# Patient Record
Sex: Male | Born: 1970 | Race: Black or African American | Hispanic: No | Marital: Married | Smoking: Former smoker
Health system: Southern US, Community
[De-identification: ages and names within clinical notes are randomized; demographics above are authoritative.]

## PROBLEM LIST (undated history)

## (undated) DIAGNOSIS — I10 Essential (primary) hypertension: Secondary | ICD-10-CM

## (undated) DIAGNOSIS — F419 Anxiety disorder, unspecified: Secondary | ICD-10-CM

## (undated) DIAGNOSIS — F1910 Other psychoactive substance abuse, uncomplicated: Secondary | ICD-10-CM

## (undated) DIAGNOSIS — K219 Gastro-esophageal reflux disease without esophagitis: Secondary | ICD-10-CM

## (undated) DIAGNOSIS — F32A Depression, unspecified: Secondary | ICD-10-CM

## (undated) HISTORY — PX: NO PAST SURGERIES: SHX2092

---

## 2007-10-30 ENCOUNTER — Ambulatory Visit: Payer: Self-pay | Admitting: Family Medicine

## 2007-10-30 ENCOUNTER — Ambulatory Visit: Payer: Self-pay | Admitting: *Deleted

## 2007-10-30 LAB — CONVERTED CEMR LAB
BUN: 13 mg/dL (ref 6–23)
Basophils Relative: 1 % (ref 0–1)
Eosinophils Absolute: 0.2 10*3/uL (ref 0.0–0.7)
Glucose, Bld: 73 mg/dL (ref 70–99)
HCT: 45.2 % (ref 39.0–52.0)
HDL: 25 mg/dL — ABNORMAL LOW (ref >39)
LDL Cholesterol: 77 mg/dL (ref 0–99)
Lymphocytes Relative: 41 % (ref 12–46)
Lymphs Abs: 2.9 10*3/uL (ref 0.7–4.0)
MCHC: 36.1 g/dL — ABNORMAL HIGH (ref 30.0–36.0)
MCV: 91.7 fL (ref 78.0–100.0)
Monocytes Absolute: 0.7 10*3/uL (ref 0.1–1.0)
Neutrophils Relative %: 46 % (ref 43–77)
Potassium: 4.1 meq/L (ref 3.5–5.3)
RBC: 4.93 M/uL (ref 4.22–5.81)
Sodium: 139 meq/L (ref 135–145)
Total Bilirubin: 0.4 mg/dL (ref 0.3–1.2)
Total CHOL/HDL Ratio: 6.5

## 2008-05-06 ENCOUNTER — Ambulatory Visit: Payer: Self-pay | Admitting: Internal Medicine

## 2008-05-15 ENCOUNTER — Ambulatory Visit: Payer: Self-pay | Admitting: Internal Medicine

## 2008-11-05 ENCOUNTER — Ambulatory Visit: Payer: Self-pay | Admitting: Family Medicine

## 2008-11-05 LAB — CONVERTED CEMR LAB
BUN: 12 mg/dL (ref 6–23)
CO2: 24 meq/L (ref 19–32)
Chloride: 104 meq/L (ref 96–112)
Cholesterol: 160 mg/dL (ref 0–200)
Glucose, Bld: 80 mg/dL (ref 70–99)
HDL: 27 mg/dL — ABNORMAL LOW (ref >39)
Sodium: 138 meq/L (ref 135–145)
Triglycerides: 253 mg/dL — ABNORMAL HIGH (ref <150)
VLDL: 51 mg/dL — ABNORMAL HIGH (ref 0–40)

## 2009-03-26 ENCOUNTER — Ambulatory Visit: Payer: Self-pay | Admitting: Family Medicine

## 2009-06-27 ENCOUNTER — Emergency Department (HOSPITAL_COMMUNITY): Admission: EM | Admit: 2009-06-27 | Discharge: 2009-06-27 | Payer: Self-pay | Admitting: Emergency Medicine

## 2009-09-08 ENCOUNTER — Ambulatory Visit: Payer: Self-pay | Admitting: Internal Medicine

## 2009-10-11 ENCOUNTER — Emergency Department (HOSPITAL_COMMUNITY): Admission: EM | Admit: 2009-10-11 | Discharge: 2009-10-11 | Payer: Self-pay | Admitting: Emergency Medicine

## 2010-06-16 LAB — URINALYSIS, ROUTINE W REFLEX MICROSCOPIC
Bilirubin Urine: NEGATIVE
Glucose, UA: NEGATIVE mg/dL
Hgb urine dipstick: NEGATIVE
Protein, ur: NEGATIVE mg/dL
Specific Gravity, Urine: 1.023 (ref 1.005–1.030)
pH: 7.5 (ref 5.0–8.0)

## 2010-06-16 LAB — POCT CARDIAC MARKERS
CKMB, poc: <1 ng/mL — ABNORMAL LOW (ref 1.0–8.0)
Myoglobin, poc: 49.1 ng/mL (ref 12–200)
Troponin i, poc: <0.05 ng/mL (ref 0.00–0.09)

## 2010-06-16 LAB — BASIC METABOLIC PANEL
BUN: 15 mg/dL (ref 6–23)
CO2: 19 mEq/L (ref 19–32)
Chloride: 106 mEq/L (ref 96–112)
GFR calc Af Amer: >60 mL/min (ref >60)
GFR calc non Af Amer: >60 mL/min (ref >60)
Glucose, Bld: 95 mg/dL (ref 70–99)

## 2010-06-16 LAB — DIFFERENTIAL
Basophils Absolute: 0.1 10*3/uL (ref 0.0–0.1)
Eosinophils Absolute: 0.2 10*3/uL (ref 0.0–0.7)
Eosinophils Relative: 2 % (ref 0–5)
Lymphs Abs: 2.9 10*3/uL (ref 0.7–4.0)
Monocytes Absolute: 0.6 10*3/uL (ref 0.1–1.0)
Monocytes Relative: 7 % (ref 3–12)
Neutrophils Relative %: 53 % (ref 43–77)

## 2010-06-16 LAB — CBC
MCV: 97.8 fL (ref 78.0–100.0)
Platelets: 231 10*3/uL (ref 150–400)
RDW: 12.8 % (ref 11.5–15.5)
WBC: 7.9 10*3/uL (ref 4.0–10.5)

## 2011-05-05 ENCOUNTER — Emergency Department (HOSPITAL_COMMUNITY)
Admission: EM | Admit: 2011-05-05 | Discharge: 2011-05-05 | Disposition: A | Discharge status: Home / Self Care | Payer: Self-pay | Attending: Emergency Medicine | Admitting: Emergency Medicine

## 2011-05-05 ENCOUNTER — Encounter (HOSPITAL_COMMUNITY): Payer: Self-pay | Admitting: *Deleted

## 2011-05-05 DIAGNOSIS — M25569 Pain in unspecified knee: Secondary | ICD-10-CM | POA: Insufficient documentation

## 2011-05-05 DIAGNOSIS — M25562 Pain in left knee: Secondary | ICD-10-CM

## 2011-05-05 MED ORDER — IBUPROFEN 800 MG PO TABS: 800.0000 mg | ORAL_TABLET | Freq: Three times a day (TID) | ORAL | Status: AC

## 2011-05-05 MED ORDER — HYDROCODONE-ACETAMINOPHEN 5-325 MG PO TABS: 2.0000 | ORAL_TABLET | ORAL | Status: AC | PRN

## 2011-05-05 NOTE — ED Provider Notes (Signed)
Medical screening examination/treatment/procedure(s) were performed by non-physician practitioner and as supervising physician I was immediately available for consultation/collaboration.  Linwood Gullikson, MD 05/05/11 0854 

## 2011-05-05 NOTE — ED Notes (Signed)
Pt reports (L) knee pain for years.  States that he 'popped' it 3 days ago and has had increased pain since that time.  No deformity, swelling, bruising noted.  Pt ambulatory.

## 2011-05-05 NOTE — ED Notes (Signed)
EDPA into room 

## 2011-05-05 NOTE — ED Notes (Signed)
Pt reports knee buckles when he walks. Reports pain 6/10, worse when walking. Sore above knee and medially. No radiation up or down leg, (denies: fever). No swelling noted, CMS intact. L knee mildly warmer than R knee.

## 2011-05-05 NOTE — ED Provider Notes (Signed)
History     CSN: [id number]  Arrival date & time 05/05/11  0611   First MD Initiated Contact with Patient 05/05/11 0636      Chief Complaint  Patient presents with  . Knee Pain    (Consider location/radiation/quality/duration/timing/severity/associated sxs/prior treatment) Patient is a 41 y.o. male presenting with knee pain. The history is provided by the patient.  Knee Pain This is a new problem. The current episode started in the past 7 days. The problem occurs constantly. The problem has been unchanged. Associated symptoms include arthralgias. Pertinent negatives include no chills or fever. Associated symptoms comments: Three days of sharp catching pain in left knee without known injury. No swelling. He complains of persistent pain anteriorly. Marland Kitchen    History reviewed. No pertinent past medical history.  History reviewed. No pertinent past surgical history.  History reviewed. No pertinent family history.  History  Substance Use Topics  . Smoking status: Current Everyday Smoker -- 0.5 packs/day for 20 years    Types: Cigarettes  . Smokeless tobacco: Not on file  . Alcohol Use: No      Review of Systems  Constitutional: Negative for fever and chills.  HENT: Negative.   Respiratory: Negative.   Cardiovascular: Negative.   Gastrointestinal: Negative.   Musculoskeletal: Positive for arthralgias.  Skin: Negative.   Neurological: Negative.     Allergies  Bee  Home Medications   Current Outpatient Rx  Name Route Sig Dispense Refill  . PANTOPRAZOLE SODIUM 40 MG PO TBEC Oral Take 40 mg by mouth daily.    Marland Kitchen PAROXETINE HCL 30 MG PO TABS Oral Take 30 mg by mouth every morning.      BP 141/84  Pulse 66  Temp(Src) 97.6 F (36.4 C) (Oral)  Resp 16  SpO2 98%  Physical Exam  Constitutional: He is oriented to person, place, and time. He appears well-developed and well-nourished.  Neck: Normal range of motion.  Pulmonary/Chest: Effort normal.  Musculoskeletal: Normal  range of motion.       Left knee unremarkable in appearance. No swelling, discoloration or warmth to touch. Joint stable. Tenderness to palpation at quadriceps insertion bilaterally. Calf and thigh are nontender. Distal pulses present.  Neurological: He is alert and oriented to person, place, and time.  Skin: Skin is warm and dry.  Psychiatric: He has a normal mood and affect.    ED Course  Procedures (including critical care time)  Labs Reviewed - No data to display No results found.   No diagnosis found.    MDM          Rodena Medin, PA-C 05/05/11 [phone]

## 2011-05-12 IMAGING — CR DG CHEST 2V
2 series · 2 of 2 positions shown · non-contrast
Comparison: None.

CLINICAL DATA: Left-sided chest pain.

CHEST - 2 VIEW

[w chest pa]
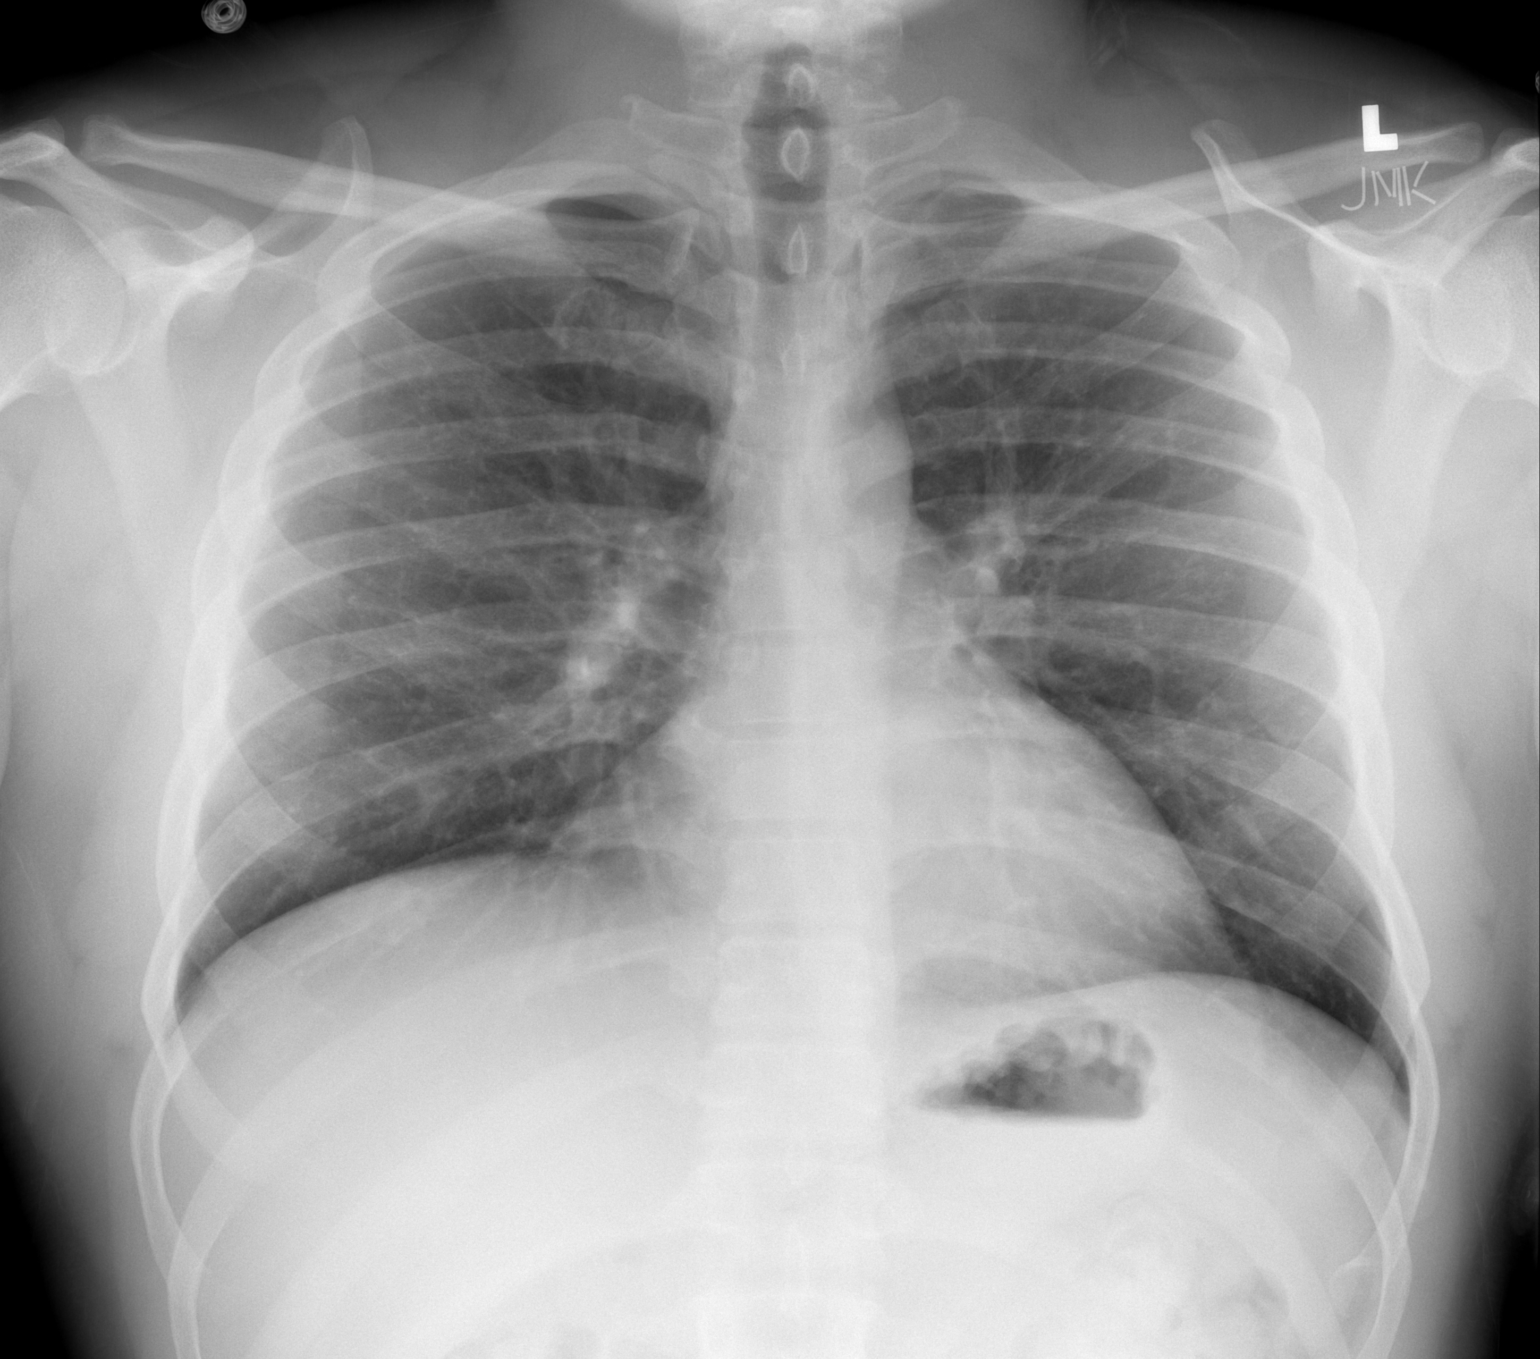

[w chest lat]
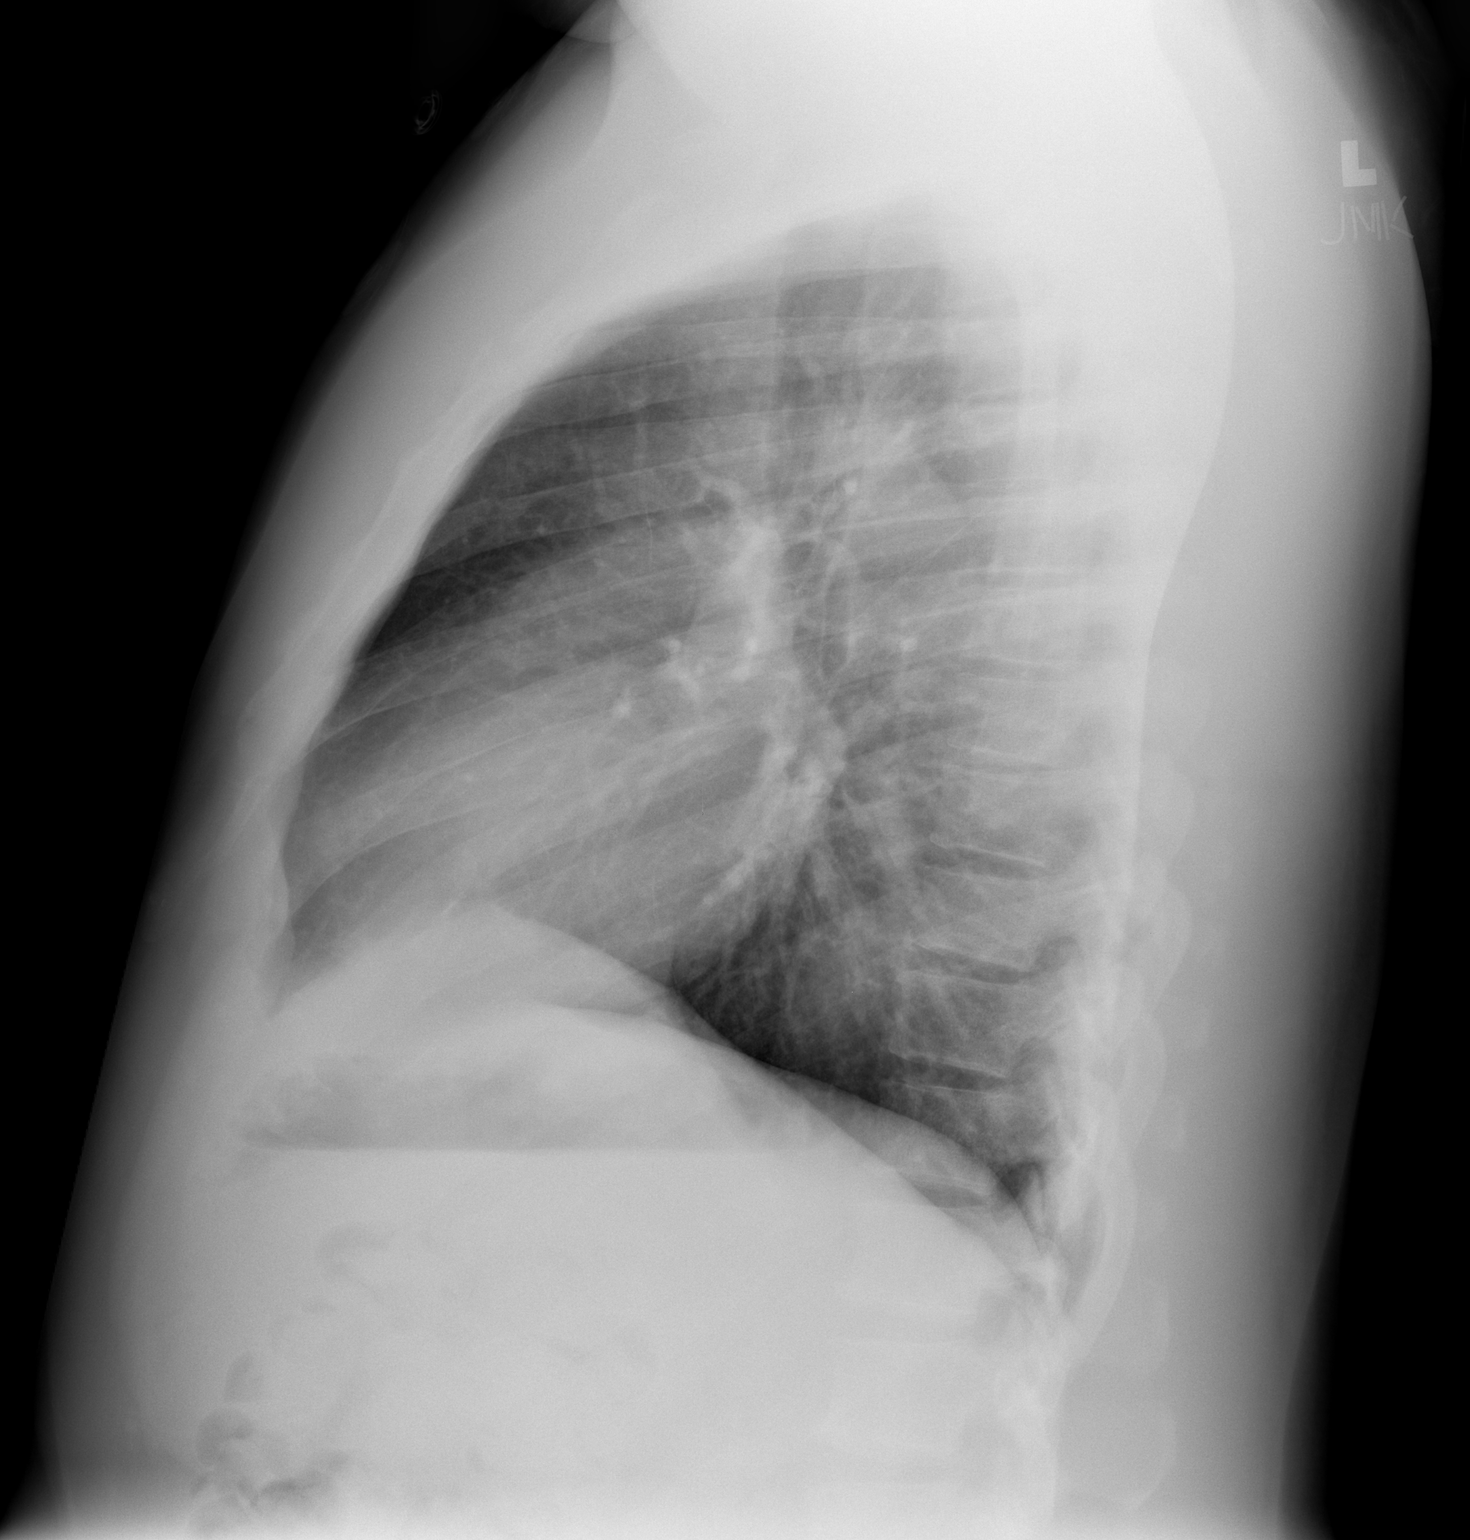

[2 of 2 positions shown; findings below may reference images not displayed]

FINDINGS: No infiltrate, congestive heart failure or pneumothorax.
Heart size top normal with minimally globular configuration.  By
plain film exam aorta appears unremarkable.
IMPRESSION: No infiltrate, congestive heart failure or pneumothorax.

Heart size top normal with minimally globular configuration.

## 2015-11-13 ENCOUNTER — Encounter (HOSPITAL_COMMUNITY): Payer: Self-pay

## 2015-11-13 ENCOUNTER — Inpatient Hospital Stay (HOSPITAL_COMMUNITY)
Admission: AD | Admit: 2015-11-13 | Discharge: 2015-11-17 | DRG: 897 | Disposition: A | Discharge status: Home / Self Care | Payer: Federal, State, Local not specified - Other | Source: Intra-hospital | Attending: Psychiatry | Admitting: Psychiatry

## 2015-11-13 ENCOUNTER — Emergency Department (HOSPITAL_COMMUNITY)
Admission: EM | Admit: 2015-11-13 | Discharge: 2015-11-13 | Payer: Self-pay | Attending: Emergency Medicine | Admitting: Emergency Medicine

## 2015-11-13 DIAGNOSIS — F17210 Nicotine dependence, cigarettes, uncomplicated: Secondary | ICD-10-CM | POA: Insufficient documentation

## 2015-11-13 DIAGNOSIS — I10 Essential (primary) hypertension: Secondary | ICD-10-CM | POA: Diagnosis present

## 2015-11-13 DIAGNOSIS — F411 Generalized anxiety disorder: Secondary | ICD-10-CM | POA: Diagnosis present

## 2015-11-13 DIAGNOSIS — F32A Depression, unspecified: Secondary | ICD-10-CM

## 2015-11-13 DIAGNOSIS — F1910 Other psychoactive substance abuse, uncomplicated: Secondary | ICD-10-CM | POA: Insufficient documentation

## 2015-11-13 DIAGNOSIS — F1190 Opioid use, unspecified, uncomplicated: Secondary | ICD-10-CM | POA: Insufficient documentation

## 2015-11-13 DIAGNOSIS — M549 Dorsalgia, unspecified: Secondary | ICD-10-CM | POA: Insufficient documentation

## 2015-11-13 DIAGNOSIS — Z791 Long term (current) use of non-steroidal anti-inflammatories (NSAID): Secondary | ICD-10-CM | POA: Diagnosis not present

## 2015-11-13 DIAGNOSIS — R45851 Suicidal ideations: Secondary | ICD-10-CM | POA: Diagnosis present

## 2015-11-13 DIAGNOSIS — Z5181 Encounter for therapeutic drug level monitoring: Secondary | ICD-10-CM | POA: Insufficient documentation

## 2015-11-13 DIAGNOSIS — G4700 Insomnia, unspecified: Secondary | ICD-10-CM | POA: Diagnosis present

## 2015-11-13 DIAGNOSIS — F329 Major depressive disorder, single episode, unspecified: Secondary | ICD-10-CM | POA: Insufficient documentation

## 2015-11-13 DIAGNOSIS — F1123 Opioid dependence with withdrawal: Secondary | ICD-10-CM | POA: Diagnosis present

## 2015-11-13 DIAGNOSIS — F1120 Opioid dependence, uncomplicated: Secondary | ICD-10-CM

## 2015-11-13 DIAGNOSIS — Z79899 Other long term (current) drug therapy: Secondary | ICD-10-CM | POA: Diagnosis not present

## 2015-11-13 DIAGNOSIS — F17290 Nicotine dependence, other tobacco product, uncomplicated: Secondary | ICD-10-CM | POA: Insufficient documentation

## 2015-11-13 LAB — COMPREHENSIVE METABOLIC PANEL
ALT: 65 U/L — ABNORMAL HIGH (ref 17–63)
AST: 42 U/L — ABNORMAL HIGH (ref 15–41)
Albumin: 4.3 g/dL (ref 3.5–5.0)
Alkaline Phosphatase: 126 U/L (ref 38–126)
Anion gap: 8 (ref 5–15)
BUN: 8 mg/dL (ref 6–20)
CO2: 24 mmol/L (ref 22–32)
Calcium: 9.8 mg/dL (ref 8.9–10.3)
Chloride: 103 mmol/L (ref 101–111)
Creatinine, Ser: 0.82 mg/dL (ref 0.61–1.24)
GFR calc Af Amer: >60 mL/min (ref >60)
GFR calc non Af Amer: >60 mL/min (ref >60)
Glucose, Bld: 96 mg/dL (ref 65–99)
Potassium: 3.9 mmol/L (ref 3.5–5.1)
Sodium: 135 mmol/L (ref 135–145)
Total Bilirubin: 0.8 mg/dL (ref 0.3–1.2)
Total Protein: 9.4 g/dL — ABNORMAL HIGH (ref 6.5–8.1)

## 2015-11-13 LAB — CBC
HCT: 43.3 % (ref 39.0–52.0)
Hemoglobin: 15.7 g/dL (ref 13.0–17.0)
MCH: 31.9 pg (ref 26.0–34.0)
MCHC: 36.3 g/dL — ABNORMAL HIGH (ref 30.0–36.0)
MCV: 88 fL (ref 78.0–100.0)
Platelets: 349 10*3/uL (ref 150–400)
RBC: 4.92 MIL/uL (ref 4.22–5.81)
RDW: 12.5 % (ref 11.5–15.5)
WBC: 7.8 10*3/uL (ref 4.0–10.5)

## 2015-11-13 LAB — SALICYLATE LEVEL: Salicylate Lvl: <4 mg/dL (ref 2.8–30.0)

## 2015-11-13 LAB — RAPID URINE DRUG SCREEN, HOSP PERFORMED
Amphetamines: NOT DETECTED
Barbiturates: NOT DETECTED
Benzodiazepines: NOT DETECTED
Cocaine: NOT DETECTED
Opiates: POSITIVE — AB
Tetrahydrocannabinol: NOT DETECTED

## 2015-11-13 LAB — ETHANOL: Alcohol, Ethyl (B): <5 mg/dL (ref <5)

## 2015-11-13 LAB — ACETAMINOPHEN LEVEL: Acetaminophen (Tylenol), Serum: <10 ug/mL — ABNORMAL LOW (ref 10–30)

## 2015-11-13 MED ORDER — CLONIDINE HCL 0.1 MG PO TABS
0.2000 mg | ORAL_TABLET | Freq: Once | ORAL | Status: AC
  Administered 2015-11-13: 0.2 mg via ORAL
  Filled 2015-11-13: qty 2

## 2015-11-13 MED ORDER — LOPERAMIDE HCL 2 MG PO CAPS
2.0000 mg | ORAL_CAPSULE | ORAL | Status: DC | PRN
  Administered 2015-11-13 – 2015-11-16 (×2): 4 mg via ORAL
  Filled 2015-11-13 (×2): qty 2

## 2015-11-13 MED ORDER — LOPERAMIDE HCL 2 MG PO CAPS: 2.0000 mg | ORAL_CAPSULE | ORAL | Status: DC | PRN

## 2015-11-13 MED ORDER — ONDANSETRON 4 MG PO TBDP: 4.0000 mg | ORAL_TABLET | Freq: Four times a day (QID) | ORAL | Status: DC | PRN

## 2015-11-13 MED ORDER — HYDROXYZINE HCL 25 MG PO TABS
25.0000 mg | ORAL_TABLET | Freq: Four times a day (QID) | ORAL | Status: DC | PRN
  Administered 2015-11-13 – 2015-11-16 (×6): 25 mg via ORAL
  Filled 2015-11-13 (×7): qty 1
  Filled 2015-11-13: qty 10

## 2015-11-13 MED ORDER — CLONIDINE HCL 0.1 MG PO TABS: 0.1000 mg | ORAL_TABLET | Freq: Four times a day (QID) | ORAL | Status: DC

## 2015-11-13 MED ORDER — CLONIDINE HCL 0.1 MG PO TABS: 0.1000 mg | ORAL_TABLET | Freq: Every day | ORAL | Status: DC

## 2015-11-13 MED ORDER — DICYCLOMINE HCL 20 MG PO TABS
20.0000 mg | ORAL_TABLET | Freq: Four times a day (QID) | ORAL | Status: DC | PRN
  Administered 2015-11-13 – 2015-11-17 (×8): 20 mg via ORAL
  Filled 2015-11-13 (×8): qty 1

## 2015-11-13 MED ORDER — DICYCLOMINE HCL 10 MG PO CAPS: 10.0000 mg | ORAL_CAPSULE | Freq: Once | ORAL | Status: DC

## 2015-11-13 MED ORDER — IBUPROFEN 200 MG PO TABS: 600.0000 mg | ORAL_TABLET | Freq: Three times a day (TID) | ORAL | Status: DC | PRN

## 2015-11-13 MED ORDER — NAPROXEN 500 MG PO TABS
500.0000 mg | ORAL_TABLET | Freq: Two times a day (BID) | ORAL | Status: DC | PRN
  Administered 2015-11-13 – 2015-11-16 (×7): 500 mg via ORAL
  Filled 2015-11-13 (×7): qty 1

## 2015-11-13 MED ORDER — ONDANSETRON 4 MG PO TBDP
4.0000 mg | ORAL_TABLET | Freq: Four times a day (QID) | ORAL | Status: DC | PRN
  Administered 2015-11-13 – 2015-11-16 (×6): 4 mg via ORAL
  Filled 2015-11-13 (×6): qty 1

## 2015-11-13 MED ORDER — DICYCLOMINE HCL 20 MG PO TABS
20.0000 mg | ORAL_TABLET | Freq: Four times a day (QID) | ORAL | Status: DC | PRN
  Administered 2015-11-13: 20 mg via ORAL
  Filled 2015-11-13: qty 1

## 2015-11-13 MED ORDER — HYDROXYZINE HCL 25 MG PO TABS: 25.0000 mg | ORAL_TABLET | Freq: Four times a day (QID) | ORAL | Status: DC | PRN

## 2015-11-13 MED ORDER — ALUM & MAG HYDROXIDE-SIMETH 200-200-20 MG/5ML PO SUSP
30.0000 mL | ORAL | Status: DC | PRN
  Administered 2015-11-13 – 2015-11-16 (×4): 30 mL via ORAL
  Filled 2015-11-13 (×4): qty 30

## 2015-11-13 MED ORDER — CLONIDINE HCL 0.1 MG PO TABS
0.1000 mg | ORAL_TABLET | ORAL | Status: AC
  Administered 2015-11-15 – 2015-11-17 (×4): 0.1 mg via ORAL
  Filled 2015-11-13 (×5): qty 1

## 2015-11-13 MED ORDER — ONDANSETRON HCL 4 MG PO TABS: 4.0000 mg | ORAL_TABLET | Freq: Three times a day (TID) | ORAL | Status: DC | PRN

## 2015-11-13 MED ORDER — CLONIDINE HCL 0.1 MG PO TABS
0.1000 mg | ORAL_TABLET | Freq: Four times a day (QID) | ORAL | Status: AC
  Administered 2015-11-13 – 2015-11-15 (×7): 0.1 mg via ORAL
  Filled 2015-11-13 (×11): qty 1

## 2015-11-13 MED ORDER — ACETAMINOPHEN 325 MG PO TABS
650.0000 mg | ORAL_TABLET | ORAL | Status: DC | PRN
  Administered 2015-11-13 – 2015-11-14 (×2): 650 mg via ORAL
  Filled 2015-11-13 (×2): qty 2

## 2015-11-13 MED ORDER — TRAZODONE HCL 50 MG PO TABS: 50.0000 mg | ORAL_TABLET | Freq: Every evening | ORAL | Status: DC | PRN

## 2015-11-13 MED ORDER — ONDANSETRON 4 MG PO TBDP
4.0000 mg | ORAL_TABLET | Freq: Once | ORAL | Status: AC
  Administered 2015-11-13: 4 mg via ORAL
  Filled 2015-11-13: qty 1

## 2015-11-13 MED ORDER — ALUM & MAG HYDROXIDE-SIMETH 200-200-20 MG/5ML PO SUSP: 30.0000 mL | ORAL | Status: DC | PRN

## 2015-11-13 MED ORDER — METHOCARBAMOL 500 MG PO TABS
500.0000 mg | ORAL_TABLET | Freq: Three times a day (TID) | ORAL | Status: DC | PRN
  Administered 2015-11-13: 500 mg via ORAL
  Filled 2015-11-13: qty 1

## 2015-11-13 MED ORDER — ACETAMINOPHEN 325 MG PO TABS: 650.0000 mg | ORAL_TABLET | ORAL | Status: DC | PRN

## 2015-11-13 MED ORDER — NAPROXEN 500 MG PO TABS: 500.0000 mg | ORAL_TABLET | Freq: Two times a day (BID) | ORAL | Status: DC | PRN

## 2015-11-13 MED ORDER — CLONIDINE HCL 0.1 MG PO TABS: 0.1000 mg | ORAL_TABLET | ORAL | Status: DC

## 2015-11-13 MED ORDER — METHOCARBAMOL 500 MG PO TABS
500.0000 mg | ORAL_TABLET | Freq: Three times a day (TID) | ORAL | Status: DC | PRN
  Administered 2015-11-13 – 2015-11-16 (×5): 500 mg via ORAL
  Filled 2015-11-13 (×5): qty 1

## 2015-11-13 MED ORDER — TRAZODONE HCL 50 MG PO TABS
50.0000 mg | ORAL_TABLET | Freq: Every evening | ORAL | Status: DC | PRN
  Administered 2015-11-13: 50 mg via ORAL
  Filled 2015-11-13: qty 1

## 2015-11-13 MED ORDER — LORAZEPAM 0.5 MG PO TABS
0.5000 mg | ORAL_TABLET | Freq: Once | ORAL | Status: AC
  Administered 2015-11-13: 0.5 mg via ORAL
  Filled 2015-11-13: qty 1

## 2015-11-13 MED ORDER — NICOTINE POLACRILEX 2 MG MT GUM
2.0000 mg | CHEWING_GUM | OROMUCOSAL | Status: DC | PRN
  Administered 2015-11-13 – 2015-11-16 (×8): 2 mg via ORAL
  Filled 2015-11-13: qty 1

## 2015-11-13 MED ORDER — CLONIDINE HCL 0.1 MG PO TABS
0.1000 mg | ORAL_TABLET | Freq: Every day | ORAL | Status: DC
  Filled 2015-11-13: qty 1

## 2015-11-13 NOTE — ED Provider Notes (Signed)
[facility]-EMERGENCY DEPT Provider Note   CSN: [id number] Arrival date & time: 11/13/15  [id number]  By signing my name below, I, Freida Busmaniana Omoyeni, attest that this documentation has been prepared under the direction and in the presence of Raeford RazorStephen Cassidee Deats, MD . Electronically Signed: Freida Busmaniana Omoyeni, Scribe. 11/13/2015. 11:02 AM.   History   Chief Complaint Chief Complaint  Patient presents with  . Suicidal  . Drug Problem    The history is provided by the patient. No language interpreter was used.   HPI Comments:  Micheal Hurley, Micheal Hurley is a 45 y.o. male who presents to the Emergency Department complaining of SI without plan x 2 days. Pt states he's been having "crazy thoughts" and has been wanting to hurt himself; denies auditory/visual hallucinations. Pt also states he is withdrawing from heroin. He has been using heroin x 2 years with no sober periods in this time. He last used ~1.5 days ago. Pt states he has been to rehab in the past for alcohol abuse but never for heroin; states he no longer drinks. At this time pt notes generalized twitching, nausea, back and knee pain. He denies h/o SI, and h/o self-injury. No alleviating factors noted.     History reviewed. No pertinent past medical history.  There are no active problems to display for this patient.   History reviewed. No pertinent surgical history.     Home Medications    Prior to Admission medications   Not on File    Family History History reviewed. No pertinent family history.  Social History Social History  Substance Use Topics  . Smoking status: Current Every Day Smoker    Packs/day: 0.50    Years: 20.00    Types: Cigarettes  . Smokeless tobacco: Current User    Types: Snuff  . Alcohol use No     Allergies   Bee venom   Review of Systems Review of Systems  Gastrointestinal: Positive for nausea. Negative for vomiting.  Musculoskeletal: Positive for back pain and myalgias.  Psychiatric/Behavioral: Positive for  suicidal ideas. Negative for hallucinations.       + Drug Problem  All other systems reviewed and are negative.    Physical Exam Updated Vital Signs BP 162/93 (BP Location: Right Arm)   Pulse 68   Temp 98.1 F (36.7 C) (Oral)   Resp 18   Ht 5\' 7"  (1.702 m)   Wt 190 lb (86.2 kg)   SpO2 100%   BMI 29.76 kg/m   Physical Exam  Constitutional: He is oriented to person, place, and time. He appears well-developed and well-nourished. No distress.  HENT:  Head: Normocephalic and atraumatic.  Eyes: Conjunctivae are normal.  Cardiovascular: Normal rate and regular rhythm.   Pulmonary/Chest: Effort normal and breath sounds normal. No respiratory distress.  Abdominal: He exhibits no distension.  Neurological: He is alert and oriented to person, place, and time.  Skin: Skin is warm and dry.  Multiple track marks; no signs of infection   Nursing note and vitals reviewed.   ED Treatments / Results  DIAGNOSTIC STUDIES:  Oxygen Saturation is 100% on RA, normal by my interpretation.    COORDINATION OF CARE:  10:59 AM Discussed treatment plan with pt at bedside and pt agreed to plan.  Labs (all labs ordered are listed, but only abnormal results are displayed) Labs Reviewed  COMPREHENSIVE METABOLIC PANEL - Abnormal; Notable for the following:       Result Value   Total Protein 9.4 (*)    AST  42 (*)    ALT 65 (*)    All other components within normal limits  ACETAMINOPHEN LEVEL - Abnormal; Notable for the following:    Acetaminophen (Tylenol), Serum <10 (*)    All other components within normal limits  CBC - Abnormal; Notable for the following:    MCHC 36.3 (*)    All other components within normal limits  URINE RAPID DRUG SCREEN, HOSP PERFORMED - Abnormal; Notable for the following:    Opiates POSITIVE (*)    All other components within normal limits  ETHANOL  SALICYLATE LEVEL    EKG  EKG Interpretation None       Radiology No results  found.  Procedures Procedures (including critical care time)  Medications Ordered in ED Medications - No data to display   Initial Impression / Assessment and Plan / ED Course  I have reviewed the triage vital signs and the nursing notes.  Pertinent labs & imaging results that were available during my care of the patient were reviewed by me and considered in my medical decision making (see chart for details).  Clinical Course   45yM with opiate abuse, increasing depression and suicidal thoughts. Medically cleared for TTS evaluation.    Final Clinical Impressions(s) / ED Diagnoses   Final diagnoses:  Depression  Substance abuse    New Prescriptions New Prescriptions   No medications on file    I personally preformed the services scribed in my presence. The recorded information has been reviewed is accurate. Raeford RazorStephen Johnwilliam Shepperson, MD.     Raeford RazorStephen Cheyenna Pankowski, MD 11/13/15 [phone]

## 2015-11-13 NOTE — ED Triage Notes (Signed)
Pt brought by [employer].  Pt states he initially called them b/c he is having suicidal thoughts.  Started 2 days ago.  Trigger:.  Trying to get help.  Has been put out of home. "things aren't going so good".  Pt also states he is an opiate abuser and going through withdrawal.  Pt last med yesterday.  Symptoms of upset stomach, nausea, "twitching" per patient.

## 2015-11-13 NOTE — Progress Notes (Signed)
Micheal Hurley is a 45 y.o. male being admitted voluntarily to [facility] from [facility].  He came to the ED with thoughts of self-harm with a plan to jump off a bridge near his home. Patient reports ongoing Heroin use (1 gram daily) for the last two years.  He stated he has been trying to maintain his sobriety by attending AA/NA for the last few months but "it isn't working."  Patient stated he thought when he woke up this morning "this was the day" and had a plan to jump off a bridge near his home. Patient stated that after thinking about it he decided to call [employer] and try getting some help. Current stressors are, he has recently lost his job, financial issues, problems with his partner and lack of employment. No history of inpatient/outpatient treatment.  He reported feelings of hopelessness, anhedonia and excessive worrying. Last use of Heroin 11/12/15 and used one gram. Patient states he is suffering from extreme "itching" and tremors at the time of assessment.  He denies any H/I or AVH. 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  He denies medical issues.  Oriented him to the unit.  Admission paperwork reviewed and signed.  Belongings searched and secured in locker 44.  Skin assessment completed and noted tattoos and discoloration on buttock and back of legs from old eczema rash.  Q 15 minute checks initiated for safety.  We will monitor the progress towards his goals.

## 2015-11-13 NOTE — ED Notes (Signed)
[facility] was called prior to pt signing for transfer at 1344.

## 2015-11-13 NOTE — ED Notes (Signed)
Lab draw unsuccessful. RN and MD made aware

## 2015-11-13 NOTE — BH Assessment (Deleted)
Assessment Note  Micheal Hurley is an 89Therese Sarah y.o. male.   Diagnosis: MDD   Past Medical History: History reviewed. No pertinent past medical history.  History reviewed. No pertinent surgical history.  Family History: History reviewed. No pertinent family history.  Social History:  reports that he has been smoking Cigarettes.  He has a 10.00 pack-year smoking history. His smokeless tobacco use includes Snuff. He reports that he uses drugs. He reports that he does not drink alcohol.  Additional Social History:  Alcohol / Drug Use Pain Medications: See MAR  Prescriptions: See MAR Over the Counter: See MAR History of alcohol / drug use?: Yes Longest period of sobriety (when/how long): 1 year Withdrawal Symptoms: Agitation, Tremors, Cramps Substance #1 Name of Substance 1: Heroin 1 - Age of First Use: 42 1 - Amount (size/oz): 1/2 to 1 gram 1 - Frequency: daily 1 - Duration: Last two years 1 - Last Use / Amount: 11/11/15 1/2 gram  CIWA: CIWA-Ar BP: 162/93 Pulse Rate: 68 COWS: Clinical Opiate Withdrawal Scale (COWS) Resting Pulse Rate: Pulse Rate 80 or below Sweating: No report of chills or flushing Restlessness: Reports difficulty sitting still, but is able to do so Pupil Size: Pupils pinned or normal size for room light Bone or Joint Aches: Patient reports sever diffuse aching of joints/muscles Runny Nose or Tearing: Nasal stuffiness or unusually moist eyes GI Upset: nausea or loose stool Tremor: No tremor Yawning: Yawning once or twice during assessment Anxiety or Irritability: Patient reports increasing irritability or anxiousness Gooseflesh Skin: Skin is smooth COWS Total Score: 8  Allergies:  Allergies  Allergen Reactions  . Bee Venom Anaphylaxis    Home Medications:  (Not in a hospital admission)  OB/GYN Status:  No LMP for male patient.  General Assessment Data Location of Assessment: [facility] TTS Assessment: In system Is this a Tele or Face-to-Face Assessment?:  Face-to-Face Is this an Initial Assessment or a Re-assessment for this encounter?: Initial Assessment Marital status: Single Maiden name: na Is patient pregnant?: No Pregnancy Status: No Living Arrangements: Spouse/significant other Can pt return to current living arrangement?: Yes Admission Status: Voluntary Is patient capable of signing voluntary admission?: Yes Referral Source: Self/Family/Friend Insurance type: None  Medical Screening Exam [facility] Walk-[facility] ONLY) Medical Exam completed: Yes  Crisis Care Plan Living Arrangements: Spouse/significant other Legal Guardian: Other: (none) Name of Psychiatrist: none Name of Therapist: none  Education Status Is patient currently in school?: No Current Grade: na Highest grade of school patient has completed: 12 Name of school: na Contact person: na  Risk to self with the past 6 months Suicidal Ideation: Yes-Currently Present Has patient been a risk to self within the past 6 months prior to admission? : No Suicidal Intent: Yes-Currently Present Has patient had any suicidal intent within the past 6 months prior to admission? : No Is patient at risk for suicide?: Yes Suicidal Plan?: Yes-Currently Present Has patient had any suicidal plan within the past 6 months prior to admission? : No Specify Current Suicidal Plan: pt plans to jump off bridge Access to Means: Yes Specify Access to Suicidal Means[place] What has been your use of drugs/alcohol within the last 12 months?: current use Previous Attempts/Gestures: No How many times?: 0 Other Self Harm Risks: none Triggers for Past Attempts: Unknown Intentional Self Injurious Behavior: None Family Suicide History: No Recent stressful life event(s): Financial Problems Persecutory voices/beliefs?: No Depression: Yes Depression Symptoms: Loss of interest in usual pleasures, Feeling worthless/self pity  Substance abuse history and/or treatment for substance  abuse?: Yes Suicide prevention information given to non-admitted patients: Not applicable  Risk to Others within the past 6 months Homicidal Ideation: No Does patient have any lifetime risk of violence toward others beyond the six months prior to admission? : No Thoughts of Harm to Others: No Current Homicidal Intent: No Current Homicidal Plan: No Access to Homicidal Means: No Identified Victim: na History of harm to others?: No Assessment of Violence: None Noted Violent Behavior Description: na Does patient have access to weapons?: No Criminal Charges Pending?: No Does patient have a court date: No Is patient on probation?: No  Psychosis Hallucinations: None noted Delusions: None noted  Mental Status Report Appearance/Hygiene: Unremarkable Eye Contact: Good Motor Activity: Freedom of movement Speech: Logical/coherent Level of Consciousness: Alert Mood: Depressed Affect: Appropriate to circumstance Anxiety Level: Moderate Thought Processes: Coherent, Relevant Judgement: Unimpaired Orientation: Person, Place, Time Obsessive Compulsive Thoughts/Behaviors: None  Cognitive Functioning Concentration: Normal Memory: Recent Intact, Remote Intact IQ: Average Insight: Fair Impulse Control: Fair Appetite: Poor Weight Loss: 0 Weight Gain: 0 Sleep: Decreased Total Hours of Sleep: 3 Vegetative Symptoms: None  ADLScreening [facility] Assessment Services) Patient's cognitive ability adequate to safely complete daily activities?: Yes Patient able to express need for assistance with ADLs?: Yes Independently performs ADLs?: Yes (appropriate for developmental age)  Prior Inpatient Therapy Prior Inpatient Therapy: No Prior Therapy Dates: na Prior Therapy Facilty/Provider(s): na Reason for Treatment: na  Prior Outpatient Therapy Prior Outpatient Therapy: No Prior Therapy Dates: none Prior Therapy Facilty/Provider(s): na Reason for Treatment: na Does patient have an ACCT team?:  No Does patient have Intensive In-House Services?  : No Does patient have [facility] services? : No Does patient have P4CC services?: No  ADL Screening (condition at time of admission) Patient's cognitive ability adequate to safely complete daily activities?: Yes Is the patient deaf or have difficulty hearing?: No Does the patient have difficulty seeing, even when wearing glasses/contacts?: No Does the patient have difficulty concentrating, remembering, or making decisions?: No Patient able to express need for assistance with ADLs?: Yes Does the patient have difficulty dressing or bathing?: No Independently performs ADLs?: Yes (appropriate for developmental age) Does the patient have difficulty walking or climbing stairs?: No Weakness of Legs: None Weakness of Arms/Hands: None  Home Assistive Devices/Equipment Home Assistive Devices/Equipment: None  Therapy Consults (therapy consults require a physician order) PT Evaluation Needed: No OT Evalulation Needed: No SLP Evaluation Needed: No Abuse/Neglect Assessment (Assessment to be complete while patient is alone) Physical Abuse: Denies Verbal Abuse: Denies Sexual Abuse: Denies Exploitation of patient/patient's resources: Denies Self-Neglect: Denies Values / Beliefs Cultural Requests During Hospitalization: None Spiritual Requests During Hospitalization: None Consults Spiritual Care Consult Needed: No Social Work Consult Needed: No Merchant navy officerAdvance Directives (For Healthcare) Does patient have an advance directive?: No Would patient like information on creating an advanced directive?: No - patient declined information    Additional Information 1:1 In Past 12 Months?: No CIRT Risk: No Elopement Risk: No Does patient have medical clearance?: Yes     Disposition:  Disposition Initial Assessment Completed for this Encounter: Yes Disposition of Patient: Other dispositions Other disposition(s): Other (Comment) (Observation Unit)  On  Site Evaluation by:   Reviewed with Physician:    Alfredia Fergusonavid L Marquin Patino 11/13/2015 11:55 AM

## 2015-11-13 NOTE — Tx Team (Signed)
Initial Interdisciplinary Treatment Plan   PATIENT STRESSORS: Financial difficulties Occupational concerns Substance abuse   PATIENT STRENGTHS: Communication skills Physical Health   PROBLEM LIST: Problem List/Patient Goals Date to be addressed Date deferred Reason deferred Estimated date of resolution  Depression 11/13/15     Suicidal ideation 11/13/15     Substance abuse 11/13/15     "Get clean" 11/13/15     "Go to a program" 11/13/15                              DISCHARGE CRITERIA:  Improved stabilization in mood, thinking, and/or behavior Verbal commitment to aftercare and medication compliance Withdrawal symptoms are absent or subacute and managed without 24-hour nursing intervention  PRELIMINARY DISCHARGE PLAN: Outpatient therapy wants long term treatment when discharged  PATIENT/FAMIILY INVOLVEMENT: This treatment plan has been presented to and reviewed with the patient, Micheal Hurley.  The patient and family have been given the opportunity to ask questions and make suggestions.  Norm ParcelHeather V Natasia Sanko 11/13/2015, 7:23 PM

## 2015-11-13 NOTE — BH Assessment (Signed)
[facility] Assessment Progress Note  Per Nehemiah MassedFernando Cobos, MD, this pt requires psychiatric hospitalization at this time.  Micheal Heinrichina Tate, RN, [facility]  Pt has signed Voluntary Admission and Consent for Treatment, as well as Consent to Release Information to no one, and signed [facility].  Pt's nurse, Micheal BraunKaren, has been notified, and agrees to send original paperwork along with pt via [facility], and to call report to [phone].  Micheal Canninghomas Bence Trapp, MA Triage Specialist [phone]

## 2015-11-13 NOTE — Progress Notes (Signed)
Patient did not attend AA group meeting. 

## 2015-11-13 NOTE — BH Assessment (Signed)
[facility] Assessment Progress Note      Case was staffed with Cobos MD who recommended an inpatient admission. [facility]

## 2015-11-13 NOTE — ED Notes (Signed)
Pt rewanded by Security.  

## 2015-11-13 NOTE — BH Assessment (Addendum)
Assessment Note  Micheal Hurley is an 45 y.o. male that presents this date with thoughts of self harm with a plan to jump off abridge near his home. Patient reports ongoing Heroin use (1 gram daily) for the last two years. Patient denies any other illicit substance use. Patient states he has been trying to maintain his sobriety by attending AA/NA for the last few months but "it isn't working." Patient states he has "given up" and for the last two days has been having thoughts of self harm. Patient stated he thought when he woke up this morning "this was the day" and had a plan to jump off a bridge near his home. Patient stated that after thinking about it he decided to call [facility] and try getting some help. 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 Patient stated he has never had any history of inpatient/outpatient treatment or been diagnosised with any MH issues. Patient does report a recent episode of depression for the last two months with symptoms to include: feelings of hopelessness, anhedonia and excessive worrying. Patient also reports that he is currently withdrawing from his last use one day ago where he reporting using a gram of Heroin. Patient states he is suffering from extreme "itching" and tremors at the time of assessment. Patient is time/place oriented denies any H/I or AVH. Patient states he has limited support but does reside with his partner Micheal Hurley [phone] who is supportive of his recovery efforts.Patient denies any current legal. Admission note stated: "Patient presents to the Emergency Department complaining of SI x 2 days. Pt states he's been having "crazy thoughts" and has been wanting to hurt himself; denies  auditory/visual hallucinations. Pt also states he is withdrawing from heroin. He has been using heroin x 2 years with no sober periods in this time. He last used ~1.5 days ago".  Case was staffed with Cobos MD who recommended an inpatient admission.   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 No pertinent past medical history.  History reviewed. No pertinent surgical history.  Family History: History reviewed. No pertinent family history.  Social History:  reports that he has been smoking Cigarettes.  He has a 10.00 pack-year smoking history. His smokeless tobacco use includes Snuff. He reports that he uses drugs. He reports that he does not drink alcohol.  Additional Social History:  Alcohol / Drug Use Pain Medications: See MAR  Prescriptions: See MAR Over the Counter: See MAR History of alcohol / drug use?: Yes Longest period of sobriety (when/how long): 1 year Withdrawal Symptoms: Agitation, Tremors, Cramps Substance #1 Name of Substance 1: Heroin 1 - Age of First Use: 42 1 - Amount (size/oz): 1/2 to 1 gram 1 - Frequency: daily 1 - Duration: Last two years 1 - Last Use / Amount: 11/11/15 1/2 gram  CIWA: CIWA-Ar BP: 162/93 Pulse Rate: 68 COWS: Clinical Opiate Withdrawal Scale (COWS) Resting Pulse Rate: Pulse Rate 80 or below Sweating: No report of chills or flushing Restlessness: Reports difficulty sitting still, but is able to do so Pupil Size: Pupils pinned or normal size for room light Bone or Joint Aches: Patient reports sever diffuse aching of joints/muscles Runny Nose or Tearing: Nasal stuffiness or  unusually moist eyes GI Upset: nausea or loose stool Tremor: No tremor Yawning: Yawning once or twice during assessment Anxiety or Irritability: Patient reports increasing irritability or anxiousness Gooseflesh Skin: Skin is smooth COWS Total Score: 8  Allergies:  Allergies  Allergen  Reactions  . Bee Venom Anaphylaxis    Home Medications:  (Not in a hospital admission)  OB/GYN Status:  No LMP for male patient.  General Assessment Data Location of Assessment: [facility] TTS Assessment: In system Is this a Tele or Face-to-Face Assessment?: Face-to-Face Is this an Initial Assessment or a Re-assessment for this encounter?: Initial Assessment Marital status: Single Maiden name: na Is patient pregnant?: No Pregnancy Status: No Living Arrangements: Spouse/significant other Can pt return to current living arrangement?: Yes Admission Status: Voluntary Is patient capable of signing voluntary admission?: Yes Referral Source: Self/Family/Friend Insurance type: None  Medical Screening Exam [facility] Walk-in ONLY) Medical Exam completed: Yes  Crisis Care Plan Living Arrangements: Spouse/significant other Legal Guardian: Other: (none) Name of Psychiatrist: none Name of Therapist: none  Education Status Is patient currently in school?: No Current Grade: na Highest grade of school patient has completed: 12 Name of school: na Contact person: na  Risk to self with the past 6 months Suicidal Ideation: Yes-Currently Present Has patient been a risk to self within the past 6 months prior to admission? : No Suicidal Intent: Yes-Currently Present Has patient had any suicidal intent within the past 6 months prior to admission? : No Is patient at risk for suicide?: Yes Suicidal Plan?: Yes-Currently Present Has patient had any suicidal plan within the past 6 months prior to admission? : No Specify Current Suicidal Plan: pt plans to jump off bridge Access to Means: Yes Specify Access to Suicidal Means: [place] What has been your use of drugs/alcohol within the last 12 months?: current use Previous Attempts/Gestures: No How many times?: 0 Other Self Harm Risks: none Triggers for Past Attempts: Unknown Intentional Self Injurious Behavior: None Family  Suicide History: No Recent stressful life event(s): Financial Problems Persecutory voices/beliefs?: No Depression: Yes Depression Symptoms: Loss of interest in usual pleasures, Feeling worthless/self pity Substance abuse history and/or treatment for substance abuse?: Yes Suicide prevention information given to non-admitted patients: Not applicable  Risk to Others within the past 6 months Homicidal Ideation: No Does patient have any lifetime risk of violence toward others beyond the six months prior to admission? : No Thoughts of Harm to Others: No Current Homicidal Intent: No Current Homicidal Plan: No Access to Homicidal Means: No Identified Victim: na History of harm to others?: No Assessment of Violence: None Noted Violent Behavior Description: na Does patient have access to weapons?: No Criminal Charges Pending?: No Does patient have a court date: No Is patient on probation?: No  Psychosis Hallucinations: None noted Delusions: None noted  Mental Status Report Appearance/Hygiene: Unremarkable Eye Contact: Good Motor Activity: Freedom of movement Speech: Logical/coherent Level of Consciousness: Alert Mood: Depressed Affect: Appropriate to circumstance Anxiety Level: Moderate Thought Processes: Coherent, Relevant Judgement: Unimpaired Orientation: Person, Place, Time Obsessive Compulsive Thoughts/Behaviors: None  Cognitive Functioning Concentration: Normal Memory: Recent Intact, Remote Intact IQ: Average Insight: Fair Impulse Control: Fair Appetite: Poor Weight Loss: 0 Weight Gain: 0 Sleep: Decreased Total Hours of Sleep: 3 Vegetative Symptoms: None  ADLScreening [facility] Assessment Services) Patient's cognitive ability adequate to safely complete daily activities?: Yes Patient able to express need for assistance with ADLs?: Yes Independently performs ADLs?: Yes (appropriate for developmental age)  Prior Inpatient  Therapy Prior Inpatient Therapy: No Prior  Therapy Dates: na Prior Therapy Facilty/Provider(s): na Reason for Treatment: na  Prior Outpatient Therapy Prior Outpatient Therapy: No Prior Therapy Dates: none Prior Therapy Facilty/Provider(s): na Reason for Treatment: na Does patient have an ACCT team?: No Does patient have Intensive In-House Services?  : No Does patient have [facility] services? : No Does patient have P4CC services?: No  ADL Screening (condition at time of admission) Patient's cognitive ability adequate to safely complete daily activities?: Yes Is the patient deaf or have difficulty hearing?: No Does the patient have difficulty seeing, even when wearing glasses/contacts?: No Does the patient have difficulty concentrating, remembering, or making decisions?: No Patient able to express need for assistance with ADLs?: Yes Does the patient have difficulty dressing or bathing?: No Independently performs ADLs?: Yes (appropriate for developmental age) Does the patient have difficulty walking or climbing stairs?: No Weakness of Legs: None Weakness of Arms/Hands: None  Home Assistive Devices/Equipment Home Assistive Devices/Equipment: None  Therapy Consults (therapy consults require a physician order) PT Evaluation Needed: No OT Evalulation Needed: No SLP Evaluation Needed: No Abuse/Neglect Assessment (Assessment to be complete while patient is alone) Physical Abuse: Denies Verbal Abuse: Denies Sexual Abuse: Denies Exploitation of patient/patient's resources: Denies Self-Neglect: Denies Values / Beliefs Cultural Requests During Hospitalization: None Spiritual Requests During Hospitalization: None Consults Spiritual Care Consult Needed: No Social Work Consult Needed: No Merchant navy officerAdvance Directives (For Healthcare) Does patient have an advance directive?: No Would patient like information on creating an advanced directive?: No - patient declined information    Additional Information 1:1 In Past 12 Months?: No CIRT  Risk: No Elopement Risk: No Does patient have medical clearance?: Yes     Disposition: Case was staffed with Cobos MD who recommended an inpatient admission.   Disposition Initial Assessment Completed for this Encounter: Yes Disposition of Patient: Other dispositions Other disposition(s): Other (Comment) (Observation Unit)  On Site Evaluation by:   Reviewed with Physician:    Alfredia Fergusonavid L Jassica Zazueta 11/13/2015 11:44 AM

## 2015-11-13 NOTE — Progress Notes (Signed)
11/13/15 1355:  Pt was asleep when LRT went to introduce self.  Caroll RancherMarjette Jenney Brester, LRT/CTRS

## 2015-11-13 NOTE — ED Notes (Signed)
Bed: [facility]36 Expected date:  Expected time:  Means of arrival:  Comments: Triage 5 

## 2015-11-13 NOTE — ED Notes (Signed)
[facility] Patient appeared in no acute distress.

## 2015-11-14 ENCOUNTER — Encounter (HOSPITAL_COMMUNITY): Payer: Self-pay | Admitting: Psychiatry

## 2015-11-14 DIAGNOSIS — F1120 Opioid dependence, uncomplicated: Secondary | ICD-10-CM

## 2015-11-14 DIAGNOSIS — F329 Major depressive disorder, single episode, unspecified: Secondary | ICD-10-CM

## 2015-11-14 MED ORDER — TRAZODONE HCL 100 MG PO TABS
100.0000 mg | ORAL_TABLET | Freq: Every evening | ORAL | Status: DC | PRN
  Administered 2015-11-14 – 2015-11-16 (×3): 100 mg via ORAL
  Filled 2015-11-14: qty 1
  Filled 2015-11-14: qty 7
  Filled 2015-11-14 (×2): qty 1

## 2015-11-14 NOTE — Progress Notes (Signed)
D: At the time of assessment, Pt endorses moderate anxiety with mild depression; states, "I will be very glad when I get over these withdrawal symptoms." Pt also complained of moderate generalized body pain; states my joints hurts allover." Pt denied depression, SI, HI, pain or AVH. Pt continues to be flat and withdrawn to his room. Pt's HR was 52 (manual); Per Dr. Andrey CampanileEappen-hold next dose of Clonidine and continue to monitor. A: Medications offered as prescribed.  Support, encouragement, and safe environment provided.  15-minute safety checks continue. R: Pt was med compliant; Pt was okay with held medication.  Pt did not attend NA group. Safety checks continue.

## 2015-11-14 NOTE — [facility] Group Notes (Signed)
[facility] Group Notes: (Clinical Social Work)   11/14/2015      Type of Therapy:  Group Therapy   Participation Level:  Did Not Attend despite MHT prompting   Ambrose MantleMareida Grossman-Orr, LCSW 11/14/2015, 1:37 PM

## 2015-11-14 NOTE — [facility] Suicide Risk Assessment (Signed)
[facility] INPATIENT:  Family/Significant Other Suicide Prevention Education  Suicide Prevention Education:  Patient Refusal for Family/Significant Other Suicide Prevention Education: The patient Micheal Hurley has refused to provide written consent for family/significant other to be provided Family/Significant Other Suicide Prevention Education during admission and/or prior to discharge.  Physician notified.  Suicide Prevention Education was done with patient, and he was given a Suicide Prevention Information brochure.  He stated he may change his mind later and allow contact with his fiancee.  Micheal Hurley 11/14/2015, 5:42 PM
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
## 2015-11-14 NOTE — H&P (Signed)
Psychiatric Admission Assessment Adult  Patient Identification: Micheal Hurley  MRN:  [id number]  Date of Evaluation:  11/14/2015  Chief Complaint:  MDD  Principal Diagnosis: Opioid withdrawal symptoms/suicidal ideations.  Diagnosis:   Patient Active Problem List   Diagnosis Date Noted  . Opioid use disorder, severe, dependence ([place]) [F11.20] 11/14/2015    Priority: High  . MDD (major depressive disorder) ([place]) [F32.9] 11/13/2015    Priority: Medium   History of Present Illness: This is an admission assessment for Micheal Hurley, a 45 year old AA male with hx of opioid use disorder, chronic. He is admitted to the [facility] adult unit from the [facility] with complaints suicidal ideations x 2 weeks, crazy thoughts & worsening use of heroin. A review of lab reports indicated elevated liver enzymes (AST & ALT). During this assessment, Micheal Hurley reports, "The cops took me to the ED yesterday. I was having some depression & other stuff going on. I also need help getting clean from drug use. I use heroin for last 2 years. There have not been any sobriety time for me & I have not been to any substance abuse treatment. I have been depressed x 1 week, which led to me feeling suicidal, but I'm not feeling like that any more. I don't need treatment for depression. I'm kind of having bad withdrawal symptoms, abdominal cramps, muscle aches, nausea etc.  I will need residential treatment after I'm done here please".  Associated Signs/Symptoms:  Depression Symptoms:  insomnia,  (Hypo) Manic Symptoms:  Impulsivity,  Anxiety Symptoms:  Excessive Worry,  Psychotic Symptoms:  Denies any hallucinations, delusional thoughts or paranoia.  PTSD Symptoms: Denies any PTSD symptoms  Total Time spent with patient: 1 hour  Past Psychiatric History: Opioid use disorder, chronic  Is the patient at risk to self? No.  Has the patient been a risk to self in the past 6 months? No.  Has the patient been a risk to  self within the distant past? No.  Is the patient a risk to others? No.  Has the patient been a risk to others in the past 6 months? No.  Has the patient been a risk to others within the distant past? No.   Prior Inpatient Therapy:   Prior Outpatient Therapy:    Alcohol Screening: 1. How often do you have a drink containing alcohol?: Never 9. Have you or someone else been injured as a result of your drinking?: No 10. Has a relative or friend or a doctor or another health worker been concerned about your drinking or suggested you cut down?: No Alcohol Use Disorder Identification Test Final Score (AUDIT): 0 Brief Intervention: AUDIT score less than 7 or less-screening does not suggest unhealthy drinking-brief intervention not indicated  Substance Abuse History in the last 12 months:  Yes.    Consequences of Substance Abuse: Medical Consequences:  Liver damage, Possible death by overdose Legal Consequences:  Arrests, jail time, Loss of driving privilege. Family Consequences:  Family discord, divorce and or separation.  Previous Psychotropic Medications: Denies  Psychological Evaluations: Yes   Past Medical History: History reviewed. No pertinent past medical history. History reviewed. No pertinent surgical history.  Family History: History reviewed. No pertinent family history.  Family Psychiatric  History: Denies any familial hx of mental illness or substance use disorder.  Tobacco Screening: Have you used any form of tobacco in the last 30 days? (Cigarettes, Smokeless Tobacco, Cigars, and/or Pipes): Yes Tobacco use, Select all that apply: 5 or more cigarettes per  day Are you interested in Tobacco Cessation Medications?: Yes, will notify MD for an order Counseled patient on smoking cessation including recognizing danger situations, developing coping skills and basic information about quitting provided: Refused/Declined practical counseling  Social History:  History  Alcohol Use No      History  Drug Use    Comment: opiates    Additional Social History:  Allergies:   Allergies  Allergen Reactions  . Bee Venom Anaphylaxis   Lab Results:  Results for orders placed or performed during the hospital encounter of 11/13/15 (from the past 48 hour(s))  Rapid urine drug screen (hospital performed)     Status: Abnormal   Collection Time: 11/13/15 11:05 AM  Result Value Ref Range   Opiates POSITIVE (A) NONE DETECTED   Cocaine NONE DETECTED NONE DETECTED   Benzodiazepines NONE DETECTED NONE DETECTED   Amphetamines NONE DETECTED NONE DETECTED   Tetrahydrocannabinol NONE DETECTED NONE DETECTED   Barbiturates NONE DETECTED NONE DETECTED    Comment:        DRUG SCREEN FOR MEDICAL PURPOSES ONLY.  IF CONFIRMATION IS NEEDED FOR ANY PURPOSE, NOTIFY LAB WITHIN 5 DAYS.        LOWEST DETECTABLE LIMITS FOR URINE DRUG SCREEN Drug Class       Cutoff (ng/mL) Amphetamine      1000 Barbiturate      200 Benzodiazepine   836 Tricyclics       629 Opiates          300 Cocaine          300 THC              50   Comprehensive metabolic panel     Status: Abnormal   Collection Time: 11/13/15 11:10 AM  Result Value Ref Range   Sodium 135 135 - 145 mmol/L   Potassium 3.9 3.5 - 5.1 mmol/L   Chloride 103 101 - 111 mmol/L   CO2 24 22 - 32 mmol/L   Glucose, Bld 96 65 - 99 mg/dL   BUN 8 6 - 20 mg/dL   Creatinine, Ser 0.82 0.61 - 1.24 mg/dL   Calcium 9.8 8.9 - 10.3 mg/dL   Total Protein 9.4 (H) 6.5 - 8.1 g/dL   Albumin 4.3 3.5 - 5.0 g/dL   AST 42 (H) 15 - 41 U/L   ALT 65 (H) 17 - 63 U/L   Alkaline Phosphatase 126 38 - 126 U/L   Total Bilirubin 0.8 0.3 - 1.2 mg/dL   GFR calc non Af Amer >60 >60 mL/min   GFR calc Af Amer >60 >60 mL/min    Comment: (NOTE) The eGFR has been calculated using the CKD EPI equation. This calculation has not been validated in all clinical situations. eGFR's persistently <60 mL/min signify possible Chronic Kidney Disease.    Anion gap 8 5 - 15   Ethanol     Status: None   Collection Time: 11/13/15 11:10 AM  Result Value Ref Range   Alcohol, Ethyl (B) <5 <5 mg/dL    Comment:        LOWEST DETECTABLE LIMIT FOR SERUM ALCOHOL IS 5 mg/dL FOR MEDICAL PURPOSES ONLY   Salicylate level     Status: None   Collection Time: 11/13/15 11:10 AM  Result Value Ref Range   Salicylate Lvl <4.7 2.8 - 30.0 mg/dL  Acetaminophen level     Status: Abnormal   Collection Time: 11/13/15 11:10 AM  Result Value Ref Range   Acetaminophen (Tylenol), Serum <10 (  L) 10 - 30 ug/mL    Comment:        THERAPEUTIC CONCENTRATIONS VARY SIGNIFICANTLY. A RANGE OF 10-30 ug/mL MAY BE AN EFFECTIVE CONCENTRATION FOR MANY PATIENTS. HOWEVER, SOME ARE BEST TREATED AT CONCENTRATIONS OUTSIDE THIS RANGE. ACETAMINOPHEN CONCENTRATIONS >150 ug/mL AT 4 HOURS AFTER INGESTION AND >50 ug/mL AT 12 HOURS AFTER INGESTION ARE OFTEN ASSOCIATED WITH TOXIC REACTIONS.   cbc     Status: Abnormal   Collection Time: 11/13/15 11:10 AM  Result Value Ref Range   WBC 7.8 4.0 - 10.5 K/uL   RBC 4.92 4.22 - 5.81 MIL/uL   Hemoglobin 15.7 13.0 - 17.0 g/dL   HCT 43.3 39.0 - 52.0 %   MCV 88.0 78.0 - 100.0 fL   MCH 31.9 26.0 - 34.0 pg   MCHC 36.3 (H) 30.0 - 36.0 g/dL   RDW 12.5 11.5 - 15.5 %   Platelets 349 150 - 400 K/uL   Blood Alcohol level:  Lab Results  Component Value Date   ETH <5 17/00/1749   Metabolic Disorder Labs:  No results found for: HGBA1C, MPG No results found for: PROLACTIN Lab Results  Component Value Date   CHOL 160 11/05/2008   TRIG 253 (H) 11/05/2008   HDL 27 (L) 11/05/2008   CHOLHDL 5.9 Ratio 11/05/2008   VLDL 51 (H) 11/05/2008   LDLCALC 82 11/05/2008   LDLCALC 77 10/30/2007   Current Medications: Current Facility-Administered Medications  Medication Dose Route Frequency Provider Last Rate Last Dose  . acetaminophen (TYLENOL) tablet 650 mg  650 mg Oral Q4H PRN Lurena Nida, NP   650 mg at 11/13/15 2054  . alum & mag hydroxide-simeth  (MAALOX/MYLANTA) 200-200-20 MG/5ML suspension 30 mL  30 mL Oral PRN Lurena Nida, NP   30 mL at 11/13/15 2056  . cloNIDine (CATAPRES) tablet 0.1 mg  0.1 mg Oral QID Lurena Nida, NP   0.1 mg at 11/14/15 1143   Followed by  . [START ON 11/15/2015] cloNIDine (CATAPRES) tablet 0.1 mg  0.1 mg Oral BH-qamhs Lurena Nida, NP       Followed by  . [START ON 11/18/2015] cloNIDine (CATAPRES) tablet 0.1 mg  0.1 mg Oral QAC breakfast Lurena Nida, NP      . dicyclomine (BENTYL) tablet 20 mg  20 mg Oral Q6H PRN Lurena Nida, NP   20 mg at 11/14/15 0816  . hydrOXYzine (ATARAX/VISTARIL) tablet 25 mg  25 mg Oral Q6H PRN Lurena Nida, NP   25 mg at 11/14/15 0815  . loperamide (IMODIUM) capsule 2-4 mg  2-4 mg Oral PRN Lurena Nida, NP   4 mg at 11/13/15 1651  . methocarbamol (ROBAXIN) tablet 500 mg  500 mg Oral Q8H PRN Lurena Nida, NP   500 mg at 11/13/15 2055  . naproxen (NAPROSYN) tablet 500 mg  500 mg Oral BID PRN Lurena Nida, NP   500 mg at 11/14/15 0815  . nicotine polacrilex (NICORETTE) gum 2 mg  2 mg Oral PRN Kerrie Buffalo, NP   2 mg at 11/13/15 1807  . ondansetron (ZOFRAN-ODT) disintegrating tablet 4 mg  4 mg Oral Q6H PRN Lurena Nida, NP   4 mg at 11/14/15 0815  . traZODone (DESYREL) tablet 50 mg  50 mg Oral QHS PRN Lurena Nida, NP   50 mg at 11/13/15 2055   PTA Medications: No prescriptions prior to admission.   Musculoskeletal: Strength & Muscle Tone: within normal limits Gait & Station:  normal Patient leans: N/A  Psychiatric Specialty Exam: Physical Exam  Constitutional: He appears well-developed.  HENT:  Head: Normocephalic.  Eyes: Pupils are equal, round, and reactive to light.  Neck: Normal range of motion.  Cardiovascular: Normal rate.   Respiratory: Effort normal.  GI: Soft.  Genitourinary:  Genitourinary Comments: Denies any issues in this area  Musculoskeletal: Normal range of motion.  Neurological: He is alert.  Skin: Skin is warm.  Psychiatric: His speech is  normal. Judgment and thought content normal. His mood appears anxious. His affect is not angry, not blunt, not labile and not inappropriate. He is agitated. Cognition and memory are normal. He does not exhibit a depressed mood.    Review of Systems  Constitutional: Positive for chills, diaphoresis and malaise/fatigue.  HENT: Negative.   Eyes: Negative.   Respiratory: Negative.   Cardiovascular: Negative.   Gastrointestinal: Positive for abdominal pain, diarrhea and nausea.  Genitourinary: Negative.   Musculoskeletal: Positive for joint pain and myalgias.  Skin: Negative.   Neurological: Positive for dizziness.  Endo/Heme/Allergies: Negative.   Psychiatric/Behavioral: Positive for substance abuse (Opoiod use diorder, chronic). Negative for depression, hallucinations, memory loss and suicidal ideas. The patient is nervous/anxious and has insomnia.     Blood pressure 121/87, pulse 80, temperature 98 F (36.7 C), resp. rate (!) 22, height _0  (1.702 m), weight 86.2 kg (190 lb).Body mass index is 29.76 kg/m.  General Appearance: Disheveled and Guarded  Eye Contact:  Minimal  Speech:  Clear and Coherent  Volume:  Decreased  Mood:  Irritable, Denies feeling or being depressed.  Affect:  Restricted  Thought Process:  Coherent and Linear  Orientation:  Full (Time, Place, and Person)  Thought Content:  Denies any hallucinations, delusional thoughts or paranoia.  Suicidal Thoughts:  Denies any thoughts, plans or intent.  Homicidal Thoughts:  Denies any thoughts, plans or intent.  Memory:  Immediate;   Good Recent;   Good Remote;   Good  Judgement:  Fair  Insight:  Fair  Psychomotor Activity:  Restlessness  Concentration:  Concentration: Poor and Attention Span: Poor  Recall:  Good  Fund of Knowledge:  Fair  Language:  Good  Akathisia:  Negative  Handed:  Right  AIMS (if indicated):     Assets:  Desire for Improvement Physical Health  ADL's:  Intact  Cognition:  WNL  Sleep:   Number of Hours: 6.25   Treatment Plan Summary: Daily contact with patient to assess and evaluate symptoms and progress in treatment and Medication management: 1. Admit for crisis management and stabilization, estimated length of stay 3-5 days.  2. Medication management to reduce current symptoms to base line and improve the patient's overall level of functioning: See [facility] for medication lists.  3. Treat health problems as indicated.  4. Develop treatment plan to decrease risk of relapse upon discharge and the need for readmission.  5. Psycho-social education regarding relapse prevention and self care.  6. Health care follow up as needed for medical problems.  7. Review, reconcile, and reinstate any pertinent home medications for other health issues where appropriate. 8. Call for consults with hospitalist for any additional specialty patient care services as needed.  Observation Level/Precautions:  15 minute checks  Laboratory:  Per ED, UDS positive for Opioids.  Psychotherapy: Group sessions  Medications: See [facility] for medication lists.   Consultations: As needed    Discharge Concerns: Safety, maintaining sobriety   Estimated LOS: 2-4 days  Other: Admit to 300 hall  I certify that inpatient services furnished can reasonably be expected to improve the patient's condition.    Encarnacion Slates, NP, PMHNP, FNP-BC 8/19/20171:33 PM

## 2015-11-14 NOTE — Progress Notes (Signed)
D: Pt was flat, isolative and withdrawn to room. Pt endorses moderate anxiety; states, "I feel lie my life is drowning." Pt also complained of moderate generalized body pain. Pt however, denied depression, SI, HI, pain or AVH. A: Medications offered as prescribed.  Support, encouragement, and safe environment provided.  15-minute safety checks continue. R: Pt was med compliant.  Pt did not attend AA group. Safety checks continue.

## 2015-11-14 NOTE — [facility] Group Notes (Signed)
[facility] Group Notes:  (Nursing/MHT/Case Management/Adjunct)  Date:  11/14/2015  Time:  3:07 PM  Type of Therapy:  Psychoeducational Skills  Participation Level:  Minimal  Participation Quality:  paid attention but declined sharing  Affect:  Blunted and Depressed  Cognitive:  unable to assess  Insight:  Limited  Engagement in Group:  Limited  Modes of Intervention:  Discussion, Exploration and Support  Summary of Progress/Problems:patient attended group with minimal participation. Stated that he was grateful to still be alive.  Vinetta BergamoBarbara M Trevyn Lumpkin 11/14/2015, 3:07 PM

## 2015-11-14 NOTE — [facility] Suicide Risk Assessment (Signed)
[facility]   Nursing information obtained from:  Patient Demographic factors:  Male Current Mental Status:  NA Loss Factors:  Financial problems / change in socioeconomic status, Decrease in vocational status Historical Factors:  NA Risk Reduction Factors:  NA  Total Time spent with patient: 30 minutes Principal Problem: Opioid use disorder, severe, dependence ([facility]) Diagnosis:   Patient Active Problem List   Diagnosis Date Noted  . Opioid use disorder, severe, dependence ([facility]) [F11.20] 11/14/2015  . MDD (major depressive disorder) ([facility]) [F32.9] 11/13/2015   Subjective Data: Patient is depressed, has been abusing heroin , has sleep issues and thoughts about hurting self.  Continued Clinical Symptoms:  Alcohol Use Disorder Identification Test Final Score (AUDIT): 0 The "Alcohol Use Disorders Identification Test", Guidelines for Use in Primary Care, Second Edition.  World Science writerHealth Organization [facility](WHO). Score between 0-7:  no or low risk or alcohol related problems. Score between 8-15:  moderate risk of alcohol related problems. Score between 16-19:  high risk of alcohol related problems. Score 20 or above:  warrants further diagnostic evaluation for alcohol dependence and treatment.   CLINICAL FACTORS:   Severe Anxiety and/or Agitation Depression:   Comorbid alcohol abuse/dependence Hopelessness Impulsivity Alcohol/Substance Abuse/Dependencies   Musculoskeletal: Strength & Muscle Tone: within normal limits Gait & Station: normal Patient leans: N/A  Psychiatric Specialty Exam: Physical Exam  Nursing note and vitals reviewed.   Review of Systems  Psychiatric/Behavioral: Positive for depression, substance abuse and suicidal ideas. The patient is nervous/anxious and has insomnia.   All other systems reviewed and are negative.   Blood pressure 121/87, pulse 80, temperature 98 F (36.7 C), resp. rate (!) 22, height 5\' 7"  (1.702 m), weight 86.2 kg (190  lb).Body mass index is 29.76 kg/m.  General Appearance: Disheveled  Eye Contact:  Fair  Speech:  Clear and Coherent  Volume:  Normal  Mood:  Anxious and Depressed  Affect:  Appropriate  Thought Process:  Goal Directed and Descriptions of Associations: Circumstantial  Orientation:  Full (Time, Place, and Person)  Thought Content:  Rumination  Suicidal Thoughts:  yes- contracts for safety on the unit  Homicidal Thoughts:  No  Memory:  Immediate;   Fair Recent;   Fair Remote;   Fair  Judgement:  Impaired  Insight:  Shallow  Psychomotor Activity:  Restlessness  Concentration:  Concentration: Fair and Attention Span: Fair  Recall:  [employer]Fair  Fund of Knowledge:  Fair  Language:  Fair  Akathisia:  No  Handed:  Right  AIMS (if indicated):     Assets:  Desire for Improvement  ADL's:  Intact  Cognition:  WNL  Sleep:  Number of Hours: 6.25      COGNITIVE FEATURES THAT CONTRIBUTE TO RISK:  Closed-mindedness, Polarized thinking and Thought constriction (tunnel vision)    SUICIDE RISK:   Moderate:  Frequent suicidal ideation with limited intensity, and duration, some specificity in terms of plans, no associated intent, good self-control, limited dysphoria/symptomatology, some risk factors present, and identifiable protective factors, including available and accessible social support.   PLAN OF CARE: case discussed with Aggie NP - please see H&p.   I certify that inpatient services furnished can reasonably be expected to improve the patient's condition.  Gayanne Prescott, MD 11/14/2015, 1:41 PM

## 2015-11-15 DIAGNOSIS — F17210 Nicotine dependence, cigarettes, uncomplicated: Secondary | ICD-10-CM

## 2015-11-15 DIAGNOSIS — Z791 Long term (current) use of non-steroidal anti-inflammatories (NSAID): Secondary | ICD-10-CM

## 2015-11-15 DIAGNOSIS — Z79899 Other long term (current) drug therapy: Secondary | ICD-10-CM

## 2015-11-15 DIAGNOSIS — R45851 Suicidal ideations: Secondary | ICD-10-CM

## 2015-11-15 NOTE — Progress Notes (Signed)
Patient ID: Micheal Hurley, male   DOB: Oct 04, 1970, 45 y.o.   MRN: [id number]  DAR: Pt. Denies SI/HI and A/V Hallucinations. He reports he continues to have withdrawal symptoms including sweats, nervousness, pain, abdominal cramping, and nausea. Patient encouraged to drink plenty of fluids to replenish what is lost during diaphoresis. Support and encouragement provided to the patient to come to writer with questions or concerns. Scheduled medications administered to patient as well as PRN medication to decrease his withdrawal symptoms. Patient is minimal but cooperative. He is seen in the milieu at times and is attending some groups. Q15 minute checks are maintained for safety.

## 2015-11-15 NOTE — [facility] Group Notes (Signed)
[facility] Group Notes:  (Clinical Social Work)  11/15/2015  10:00-11:00AM  Summary of Progress/Problems:   The main focus of today's process group was to process the disruptive behaviors that had been occurring on the 30 hall and creating problems with patients receiving the therapeutic care needed.  Per nursing staff, a helpful topic for group was identified as:  1)  Each patient focusing on their own recovery  2)  Treatment Agreement that each patient signed upon admission  And CSW further added the following:  1)  The importance of adding supports   2)  Possible means of dealing with the stigma about mental health issues.  At the beginning of group several patients were verbally aggressive with CSW.  These patients left the room in anger, as did several others who stated they could not handle the aggression and were going to their rooms.   The patient expressed that he did not feel well before group started, and thought he would be unable to stay the entire time, but was willing to try.  However, when there were anger outbursts, he quickly left.  Type of Therapy:  Process Group with Motivational Interviewing  Participation Level:  None  Participation Quality:  Unwilling  Affect:  Flat  Cognitive:  Not assessed  Insight:  N/A  Engagement in Therapy:  Poor  Modes of Intervention:   Education, Support and Processing, Activity  Ambrose MantleMareida Grossman-Orr, LCSW 11/15/2015

## 2015-11-15 NOTE — Progress Notes (Signed)
Pt attended evening AA group. 

## 2015-11-15 NOTE — Progress Notes (Signed)
[facility] MD Progress Note  11/15/2015 2:42 PM Zoran Yankee  MRN:  [id number]  Subjective: Micheal Hurley reports, "I'm not feeling as bad as I was yesterday. I still got abdominal cramps, muscle aches & high anxiety levels. These symptoms are a little bit tolerable today. I ate breakfast. I have been up & attended group sessions this morning. I cannot say that I'm depressed, just not happy with my drug use. I would like to go a treatment place [facility]".   Principal Problem: Opioid use disorder, severe, dependence (HCC)  Diagnosis:   Patient Active Problem List   Diagnosis Date Noted  . Opioid use disorder, severe, dependence (HCC) [F11.20] 11/14/2015    Priority: High  . MDD (major depressive disorder) (HCC) [F32.9] 11/13/2015    Priority: Medium   Total Time spent with patient: 25 minutes  Past Psychiatric History: Opioid use disorder, chronic.  Past Medical History: History reviewed. No pertinent past medical history. History reviewed. No pertinent surgical history.  Family History:  Family History  Problem Relation Age of Onset  . Mental illness Neg Hx    Family Psychiatric  History: See H&P  Social History:  History  Alcohol Use No     History  Drug Use    Comment: opiates    Social History   Social History  . Marital status: Single    Spouse name: N/A  . Number of children: N/A  . Years of education: N/A   Social History Main Topics  . Smoking status: Current Every Day Smoker    Packs/day: 0.50    Years: 20.00    Types: Cigarettes  . Smokeless tobacco: Current User    Types: Snuff  . Alcohol use No  . Drug use:      Comment: opiates  . Sexual activity: Not Asked   Other Topics Concern  . None   Social History Narrative  . None   Additional Social History:   Sleep: Good  Appetite:  Good  Current Medications: Current Facility-Administered Medications  Medication Dose Route Frequency Provider Last Rate Last Dose  . acetaminophen (TYLENOL)  tablet 650 mg  650 mg Oral Q4H PRN Kristeen Mans, NP   650 mg at 11/14/15 1758  . alum & mag hydroxide-simeth (MAALOX/MYLANTA) 200-200-20 MG/5ML suspension 30 mL  30 mL Oral PRN Kristeen Mans, NP   30 mL at 11/14/15 2034  . cloNIDine (CATAPRES) tablet 0.1 mg  0.1 mg Oral QID Kristeen Mans, NP   0.1 mg at 11/15/15 1200   Followed by  . cloNIDine (CATAPRES) tablet 0.1 mg  0.1 mg Oral BH-qamhs Kristeen Mans, NP       Followed by  . [START ON 11/18/2015] cloNIDine (CATAPRES) tablet 0.1 mg  0.1 mg Oral QAC breakfast Kristeen Mans, NP      . dicyclomine (BENTYL) tablet 20 mg  20 mg Oral Q6H PRN Kristeen Mans, NP   20 mg at 11/15/15 0615  . hydrOXYzine (ATARAX/VISTARIL) tablet 25 mg  25 mg Oral Q6H PRN Kristeen Mans, NP   25 mg at 11/14/15 1758  . loperamide (IMODIUM) capsule 2-4 mg  2-4 mg Oral PRN Kristeen Mans, NP   4 mg at 11/13/15 1651  . methocarbamol (ROBAXIN) tablet 500 mg  500 mg Oral Q8H PRN Kristeen Mans, NP   500 mg at 11/14/15 1757  . naproxen (NAPROSYN) tablet 500 mg  500 mg Oral BID PRN Kristeen Mans, NP   500  mg at 11/15/15 [id number]  . nicotine polacrilex (NICORETTE) gum 2 mg  2 mg Oral PRN Adonis BrookSheila Agustin, NP   2 mg at 11/15/15 1200  . ondansetron (ZOFRAN-ODT) disintegrating tablet 4 mg  4 mg Oral Q6H PRN Kristeen MansFran E Hobson, NP   4 mg at 11/15/15 1204  . traZODone (DESYREL) tablet 100 mg  100 mg Oral QHS PRN Jomarie LongsSaramma Eappen, MD   100 mg at 11/14/15 2054   Lab Results: No results found for this or any previous visit (from the past 48 hour(s)).  Blood Alcohol level:  Lab Results  Component Value Date   ETH <5 11/13/2015   Metabolic Disorder Labs: No results found for: HGBA1C, MPG No results found for: PROLACTIN Lab Results  Component Value Date   CHOL 160 11/05/2008   TRIG 253 (H) 11/05/2008   HDL 27 (L) 11/05/2008   CHOLHDL 5.9 Ratio 11/05/2008   VLDL 51 (H) 11/05/2008   LDLCALC 82 11/05/2008   LDLCALC 77 10/30/2007   Physical Findings: AIMS:  , ,  ,  ,    CIWA:  CIWA-Ar Total:  1 COWS:  COWS Total Score: 10  Musculoskeletal: Strength & Muscle Tone: within normal limits Gait & Station: normal Patient leans: N/A  Psychiatric Specialty Exam: Physical Exam  Constitutional: He appears well-developed and well-nourished.  HENT:  Head: Normocephalic.  Eyes: Pupils are equal, round, and reactive to light.  Neck: Normal range of motion.  Cardiovascular: Normal rate.   Respiratory: Effort normal.    Review of Systems  Constitutional: Positive for chills, diaphoresis and malaise/fatigue.  Skin: Negative.     Blood pressure 130/89, pulse 67, temperature 97.9 F (36.6 C), resp. rate 20, height 5\' 7"  (1.702 m), weight 86.2 kg (190 lb).Body mass index is 29.76 kg/m.  General Appearance: Disheveled  Eye Contact:  Fair  Speech:  Clear and Coherent  Volume:  Normal  Mood:  Anxious and Depressed  Affect:  Appropriate  Thought Process:  Goal Directed and Descriptions of Associations: Circumstantial  Orientation:  Full (Time, Place, and Person)  Thought Content:  Rumination  Suicidal Thoughts:  yes- contracts for safety on the unit  Homicidal Thoughts:  No  Memory:  Immediate;   Fair Recent;   Fair Remote;   Fair  Judgement:  Impaired  Insight:  Shallow  Psychomotor Activity:  Restlessness  Concentration:  Concentration: Fair and Attention Span: Fair  Recall:  [employer]  Fund of Knowledge:  Fair  Language:  Fair  Akathisia:  No  Handed:  Right  AIMS (if indicated):     Assets:  Desire for Improvement  ADL's:  Intact  Cognition:  WNL  Sleep:  Number of Hours: 4.5     Assessment: Micheal MostCharles is a 45 year old AA male with hx of opioid use disorder, chronic. [facility] with complaints suicidal ideations x 2 weeks, crazy thoughts & worsening use of heroin. A review of lab reports indicated elevated liver enzymes (AST & ALT).  Treatment Plan Summary: Daily contact with patient to assess and evaluate symptoms and  progress in treatment and Medication management:  Opioid withdrawal symptoms: Continue the Clonidine detox protocols. Insomnia: Continue Trazodone 50 mg. - Continue 15 minutes observation for safety concerns - Encouraged to participate in milieu therapy and group therapy counseling sessions and also work with coping skills -  Develop treatment plan to decrease risk of relapse upon discharge and to reduce the need for readmission. -  Psycho-social education regarding relapse prevention and self care. - Health care follow up as needed for medical problems. - Restart home medications where appropriate.  Sanjuana KavaNwoko, Demitrious Mccannon I, NP, PMHNP, FNP-BC 11/15/2015, 2:42 PM

## 2015-11-15 NOTE — [facility] Group Notes (Signed)
[facility] Group Notes:  (Nursing/MHT/Case Management/Adjunct)  Date:  11/15/2015  Time:  12:57 PM  Type of Therapy:  Psychoeducational Skills  Participation Level:  Minimal  Participation Quality:  Attentive  Affect:  Blunted  Cognitive:  Alert and Appropriate  Insight:  Appropriate  Engagement in Group:  Engaged  Modes of Intervention:  Discussion and Education  Summary of Progress/Problems: Patient attended group and was actively listening but did not speak.   Marzetta BoardDopson, Freddy Spadafora E 11/15/2015, 12:57 PM

## 2015-11-15 NOTE — Progress Notes (Signed)
Nutrition Brief Note  Patient identified on the Malnutrition Screening Tool (MST) Report  Wt Readings from Last 15 Encounters:  11/13/15 190 lb (86.2 kg)  11/13/15 190 lb (86.2 kg)    Body mass index is 29.76 kg/m. Patient meets criteria for overweight based on current BMI.   Diet Order: Diet regular Room service appropriate? Yes; Fluid consistency: Thin Pt is also offered choice of unit snacks mid-morning and mid-afternoon.   Labs and medications reviewed.   No nutrition interventions warranted at this time. If nutrition issues arise, please consult RD.   Micheal FrancoLindsey Micheal Pavlich, MS, RD, LDN Pager: [phone] After Hours Pager: [phone]

## 2015-11-16 MED ORDER — SERTRALINE HCL 25 MG PO TABS
25.0000 mg | ORAL_TABLET | Freq: Every day | ORAL | Status: DC
  Administered 2015-11-16 – 2015-11-17 (×2): 25 mg via ORAL
  Filled 2015-11-16 (×4): qty 1

## 2015-11-16 NOTE — Progress Notes (Signed)
Patient ID: Micheal Hurley, male   DOB: 1970-12-07, 45 y.o.   MRN: [id number]  DAR: Pt. Denies SI/HI and A/V Hallucinations. He reports his major concern today is that he feels like he is going to not be able to get a new job that he applied for because he is here. Writer spoke to patient about the importance of safely detoxing before discharge. Patient continue to report withdrawal symptoms including stomach upset, anxiety, tremor, and generalized pain. He continued to receive PRN medication to decrease these symptoms. He signed a 72 hour request for discharge this afternoon and this was placed on patient's physical chart. Support and encouragement provided to the patient. Scheduled medications administered to patient per physician's orders. Patient is guarded but cooperative. He is seen in the milieu and is attending some groups. Q15 minute checks are maintained for safety.

## 2015-11-16 NOTE — Progress Notes (Signed)
Patient ID: Micheal Hurley, male   DOB: 12-Apr-1970, 45 y.o.   MRN: [id number]  Leonette MostCharles signed a 72 hour request for discharge on 11/16/15 at 1333.

## 2015-11-16 NOTE — Progress Notes (Signed)
Patient ID: Micheal [name], male   DOB: Mar 14, 1971, 45 y.o.   MRN: [id number] [facility]  11/16/2015 4:54 PM Micheal Hurley  MRN:  [id number]  Subjective: Micheal Hurley reports,  Patient seen, chart reviewed and case discussed with nursing staff.   He states that he continues to have abdominal pain although it has been improving. He feels better today. He states that he went to ED as he was scared of his Si thought; he denies any today. He is motivated for sobriety but is concerned that he might relapse into heroin use if he were to have mood symptoms again. He is planning to go back to his fiancee's place once he is discharged.   Principal Problem: Opioid use disorder, severe, dependence (HCC)  Diagnosis:   Patient Active Problem List   Diagnosis Date Noted  . Opioid use disorder, severe, dependence (HCC) [F11.20] 11/14/2015  . MDD (major depressive disorder) (HCC) [F32.9] 11/13/2015   Total Time spent with patient: 15 minutes  Past Psychiatric History: Opioid use disorder, chronic.  Past Medical History: History reviewed. No pertinent past medical history. History reviewed. No pertinent surgical history.  Family History:  Family History  Problem Relation Age of Onset  . Mental illness Neg Hx    Family Psychiatric  History: See H&P  Social History:  History  Alcohol Use No     History  Drug Use    Comment: opiates    Social History   Social History  . Marital status: Single    Spouse name: N/A  . Number of children: N/A  . Years of education: N/A   Social History Main Topics  . Smoking status: Current Every Day Smoker    Packs/day: 0.50    Years: 20.00    Types: Cigarettes  . Smokeless tobacco: Current User    Types: Snuff  . Alcohol use No  . Drug use:      Comment: opiates  . Sexual activity: Not Asked   Other Topics Concern  . None   Social History Narrative  . None   Additional Social History:   Sleep: Good  Appetite:  Good  Current  Medications: Current Facility-Administered Medications  Medication Dose Route Frequency Provider Last Rate Last Dose  . acetaminophen (TYLENOL) tablet 650 mg  650 mg Oral Q4H PRN Kristeen MansFran E Hobson, NP   650 mg at 11/14/15 1758  . alum & mag hydroxide-simeth (MAALOX/MYLANTA) 200-200-20 MG/5ML suspension 30 mL  30 mL Oral PRN Kristeen MansFran E Hobson, NP   30 mL at 11/15/15 2053  . cloNIDine (CATAPRES) tablet 0.1 mg  0.1 mg Oral BH-qamhs Kristeen MansFran E Hobson, NP   0.1 mg at 11/16/15 0804   Followed by  . [START ON 11/18/2015] cloNIDine (CATAPRES) tablet 0.1 mg  0.1 mg Oral QAC breakfast Kristeen MansFran E Hobson, NP      . dicyclomine (BENTYL) tablet 20 mg  20 mg Oral Q6H PRN Kristeen MansFran E Hobson, NP   20 mg at 11/16/15 1546  . hydrOXYzine (ATARAX/VISTARIL) tablet 25 mg  25 mg Oral Q6H PRN Kristeen MansFran E Hobson, NP   25 mg at 11/16/15 1251  . loperamide (IMODIUM) capsule 2-4 mg  2-4 mg Oral PRN Kristeen MansFran E Hobson, NP   4 mg at 11/16/15 [id number]  . methocarbamol (ROBAXIN) tablet 500 mg  500 mg Oral Q8H PRN Kristeen MansFran E Hobson, NP   500 mg at 11/16/15 [id number]  . naproxen (NAPROSYN) tablet 500 mg  500 mg Oral BID PRN Kristeen MansFran E Hobson, NP  500 mg at 11/16/15 1251  . nicotine polacrilex (NICORETTE) gum 2 mg  2 mg Oral PRN Adonis BrookSheila Agustin, NP   2 mg at 11/16/15 1545  . ondansetron (ZOFRAN-ODT) disintegrating tablet 4 mg  4 mg Oral Q6H PRN Kristeen MansFran E Hobson, NP   4 mg at 11/16/15 1546  . traZODone (DESYREL) tablet 100 mg  100 mg Oral QHS PRN Jomarie LongsSaramma Eappen, MD   100 mg at 11/15/15 2051   Lab Results: No results found for this or any previous visit (from the past 48 hour(s)).  Blood Alcohol level:  Lab Results  Component Value Date   ETH <5 11/13/2015   Metabolic Disorder Labs: No results found for: HGBA1C, MPG No results found for: PROLACTIN Lab Results  Component Value Date   CHOL 160 11/05/2008   TRIG 253 (H) 11/05/2008   HDL 27 (L) 11/05/2008   CHOLHDL 5.9 Ratio 11/05/2008   VLDL 51 (H) 11/05/2008   LDLCALC 82 11/05/2008   LDLCALC 77 10/30/2007   Physical  Findings: AIMS:  , ,  ,  ,    CIWA:  CIWA-Ar Total: 1 COWS:  COWS Total Score: 7  Musculoskeletal: Strength & Muscle Tone: within normal limits Gait & Station: normal Patient leans: N/A  Psychiatric Specialty Exam: Physical Exam  Constitutional: He appears well-developed and well-nourished.  HENT:  Head: Normocephalic.  Eyes: Pupils are equal, round, and reactive to light.  Neck: Normal range of motion.  Cardiovascular: Normal rate.   Respiratory: Effort normal.    Review of Systems  Constitutional: Negative for chills, diaphoresis and malaise/fatigue.  Gastrointestinal: Positive for abdominal pain.  Skin: Negative.   Psychiatric/Behavioral: Positive for depression. Negative for hallucinations and suicidal ideas. The patient is nervous/anxious and has insomnia.     Blood pressure 139/89, pulse 65, temperature 98.7 F (37.1 C), temperature source Oral, resp. rate 16, height 5\' 7"  (1.702 m), weight 190 lb (86.2 kg).Body mass index is 29.76 kg/m.  General Appearance: fairly groomed  Eye Contact:  good  Speech:  Clear and Coherent  Volume:  Normal  Mood:  "better"  Affect:  slightly constricted  Thought Process:  Goal Directed and Descriptions of Associations: linear. logical  Orientation:  Full (Time, Place, and Person)  Thought Content:  no paranoia, Perceptions: denies AH/VH  Suicidal Thoughts:  denies  Homicidal Thoughts:  No  Memory:  Immediate;   Fair Recent;   Fair Remote;   Fair  Judgement:  Impaired  Insight:  Shallow  Psychomotor Activity:  normal  Concentration:  Concentration: Fair and Attention Span: Fair  Recall:  [employer]Fair  Fund of Knowledge:  Fair  Language:  Fair  Akathisia:  No  Handed:  Right  AIMS (if indicated):     Assets:  Desire for Improvement  ADL's:  Intact  Cognition:  WNL  Sleep:  Number of Hours: 4.5     Assessment: Micheal Hurley is a 45 year old AA male with hx of opioid use disorder, chronic. [facility] with complaints suicidal ideations x 2 weeks, crazy thoughts & worsening use of heroin.  # MDD # r/o substance induced mood disorder He reports neurovegetative symptoms preceding this admission; although it is difficult to discern in the context of heroin use, will start sertraline to target his mood. Will check TSH.   Treatment Plan Summary: Daily contact with patient to assess and evaluate symptoms and progress in treatment and Medication management:  - Start sertraline  25 mg daily - Check TSH, LFT Opioid withdrawal symptoms: Continue the Clonidine detox protocols. Insomnia: Continue Trazodone 50 mg. - Continue 15 minutes observation for safety concerns - Encouraged to participate in milieu therapy and group therapy counseling sessions and also work with coping skills -  Develop treatment plan to decrease risk of relapse upon discharge and to reduce the need for readmission. -  Psycho-social education regarding relapse prevention and self care. - Health care follow up as needed for medical problems. - Restart home medications where appropriate.  Neysa Hotter, MD,  11/16/2015, 4:54 PM

## 2015-11-16 NOTE — Progress Notes (Signed)
D: Patient anxious and irritable this shift. Pt with short replies to assessment questions and was initially reluctant to attend AA group this evening but did attend with much encouragement. Pt compliant with HS medications and denies SI/HI or plans to harm himself.  A: Q 15 minute safety checks, encourage staff/peer interaction and group participation. Administer medications as ordered. R: No s/s of distress noted this shift.

## 2015-11-16 NOTE — [facility] Group Notes (Signed)
[facility] LCSW Group Therapy 11/16/2015  1:15 pm  Type of Therapy: Group Therapy Participation Level: Active  Participation Quality: Attentive, Sharing and Supportive  Affect: Anxious, blunted   Cognitive: Alert and Oriented  Insight: Developing/Improving and Engaged  Engagement in Therapy: Developing/Improving and Engaged  Modes of Intervention: Clarification, Confrontation, Discussion, Education, Exploration,  Limit-setting, Orientation, Problem-solving, Rapport Building, Dance movement psychotherapisteality Testing, Socialization and Support  Summary of Progress/Problems: Pt identified obstacles faced currently and processed barriers involved in overcoming these obstacles. Pt identified steps necessary for overcoming these obstacles and explored motivation (internal and external) for facing these difficulties head on. Pt further identified one area of concern in their lives and chose a goal to focus on for today. Patient participated minimally in discussion despite CSW encouragement.   Samuella BruinKristin Austine Kelsay, LCSW Clinical Social Worker [facility] [phone]

## 2015-11-16 NOTE — Progress Notes (Signed)
Recreation Therapy Notes  Date: 11/16/15 Time: 0930 Location: 300 Hall Group Room  Group Topic: Stress Management  Goal Area(s) Addresses:  Patient will verbalize importance of using healthy stress management.  Patient will identify positive emotions associated with healthy stress management.   Intervention: Stress Management  Activity :  Progressive Muscle Relaxation.  LRT introduced the stress management technique of progressive muscle relaxation to the patients.  Patients were to follow along as LRT read script to engaged in the technique.  Education:  Stress Management, Discharge Planning.   Education Outcome: Acknowledges edcuation/In group clarification offered/Needs additional education  Clinical Observations/Feedback: Pt did not attend group.   Micheal Hurley, LRT/CTRS    Micheal Hurley, Micheal Hurley 11/16/2015 1:02 PM

## 2015-11-17 LAB — HEPATIC FUNCTION PANEL
ALBUMIN: 4.1 g/dL (ref 3.5–5.0)
ALK PHOS: 101 U/L (ref 38–126)
ALT: 42 U/L (ref 17–63)
AST: 26 U/L (ref 15–41)
Bilirubin, Direct: <0.1 mg/dL — ABNORMAL LOW (ref 0.1–0.5)
TOTAL PROTEIN: 8.8 g/dL — AB (ref 6.5–8.1)
Total Bilirubin: 0.8 mg/dL (ref 0.3–1.2)

## 2015-11-17 LAB — TSH: TSH: 1.559 u[IU]/mL (ref 0.350–4.500)

## 2015-11-17 MED ORDER — TRAZODONE HCL 100 MG PO TABS: 100.0000 mg | ORAL_TABLET | Freq: Every evening | ORAL | 0 refills | Status: DC | PRN

## 2015-11-17 MED ORDER — SERTRALINE HCL 25 MG PO TABS: 25.0000 mg | ORAL_TABLET | Freq: Every day | ORAL | 0 refills | Status: DC

## 2015-11-17 MED ORDER — CLONIDINE HCL 0.1 MG PO TABS: 0.1000 mg | ORAL_TABLET | Freq: Every day | ORAL | 0 refills | Status: DC

## 2015-11-17 MED ORDER — HYDROXYZINE HCL 25 MG PO TABS: 25.0000 mg | ORAL_TABLET | Freq: Four times a day (QID) | ORAL | 0 refills | Status: DC | PRN

## 2015-11-17 MED ORDER — NICOTINE POLACRILEX 2 MG MT GUM: 2.0000 mg | CHEWING_GUM | OROMUCOSAL | 0 refills | Status: DC | PRN

## 2015-11-17 NOTE — Progress Notes (Signed)
Patient ID: Micheal Hurley, male   DOB: 04-Oct-1970, 45 y.o.   MRN: [id number] Patient discharged to home/self care.  Patient acknowledged understanding of discharge instructions and receipt of all belongings.  Patient expressed the desire for sobriety upon discharged and was able to contract for safety.  Patient upon discharge patient's affect was bright and he was hopeful to maintain his sobriety.

## 2015-11-17 NOTE — [facility] Suicide Risk Assessment (Signed)
[facility]   Principal Problem: Opioid use disorder, severe, dependence [facility](HCC) Discharge Diagnoses:  Patient Active Problem List   Diagnosis Date Noted  . Opioid use disorder, severe, dependence (HCC) [F11.20] 11/14/2015  . MDD (major depressive disorder) (HCC) [F32.9] 11/13/2015    Total Time spent with patient: 20 minutes  Musculoskeletal: Strength & Muscle Tone: within normal limits Gait & Station: normal Patient leans: N/A  Psychiatric Specialty Exam: Review of Systems  Psychiatric/Behavioral: Positive for substance abuse. Negative for depression, hallucinations and suicidal ideas. The patient is not nervous/anxious and does not have insomnia.   All other systems reviewed and are negative.   Blood pressure (!) 146/94, pulse 79, temperature 98.3 F (36.8 C), temperature source Oral, resp. rate 20, height 5\' 7"  (1.702 m), weight 190 lb (86.2 kg).Body mass index is 29.76 kg/m.  General Appearance: Casual  Eye Contact::  Good  Speech:  Normal Rate  Volume:  Normal  Mood:  "good"  Affect:  Appropriate  Thought Process:  Coherent  Orientation:  Full (Time, Place, and Person)  Thought Content:  Logical  Suicidal Thoughts:  No  Homicidal Thoughts:  No  Memory:  Negative  Judgement:  Fair  Insight:  Fair  Psychomotor Activity:  Normal  Concentration:  Good  Recall:  Good  Fund of Knowledge:Good  Language: Good  Akathisia:  No  Handed:  Right  AIMS (if indicated):     Assets:  Communication Skills Desire for Improvement  Sleep:  Number of Hours: 6.25  Cognition: WNL  ADL's:  Intact   Mental Status Per Nursing Assessment::   On Admission:  NA  Demographic Factors:  Male  Loss Factors: NA  Historical Factors: Family history of mental illness or substance abuse Mother- depression  Risk Reduction Factors:   Sense of responsibility to family, Employed and Positive social support  Continued Clinical Symptoms:  Depression:   Comorbid  alcohol abuse/dependence Alcohol/Substance Abuse/Dependencies  Cognitive Features That Contribute To Risk:  None    Suicide Risk:  Mild:  Suicidal ideation of limited frequency, intensity, duration, and specificity.  There are no identifiable plans, no associated intent, mild dysphoria and related symptoms, good self-control (both objective and subjective assessment), few other risk factors, and identifiable protective factors, including available and accessible social support.  Follow-up Information    [facility] .   Specialty:  Behavioral Health Why:  Walk in clinic on Mondays, Wednesdays, and Fridays at 12:30 for assessment for therapy and medication management services.  Contact information: [address] [place] [address] [phone]          He will be discharged to his fiancee's house. He is motivated for sobriety and denies any SI/HI.   Plan Of Care/Follow-up recommendations:  Activity:  full Diet:  regular Tests:  none Other:  follow up as above  Neysa Hottereina Elisea Khader, MD 11/17/2015, 10:19 AM

## 2015-11-17 NOTE — Progress Notes (Signed)
  [facility] :  Will you be returning to the same living situation after discharge:  Yes,  patient plans to return home At discharge, do you have transportation home?: Yes,  bus pass Do you have the ability to pay for your medications: Yes,  patient will be provided with prescriptions at discharge  Release of information consent forms completed and in the chart;  Patient's signature needed at discharge.  Patient to Follow up at: Follow-up Information    [facility] .   Specialty:  Behavioral Health Why:  Walk in clinic on Mondays, Wednesdays, and Fridays at 12:30 for assessment for therapy and medication management services.  Contact information: [address] [place] [address] [phone]           Next level of care provider has access to [facility] Link:no  Safety Planning and Suicide Prevention discussed: Yes,  with patient  Have you used any form of tobacco in the last 30 days? (Cigarettes, Smokeless Tobacco, Cigars, and/or Pipes): Yes  Has patient been referred to the Quitline?: Patient refused referral  Patient has been referred for addiction treatment: Yes  Analisse Randle L Alysson Geist 11/17/2015, 10:04 AM

## 2015-11-17 NOTE — Discharge Summary (Signed)
Physician Discharge Summary Note  Patient:  Micheal Hurley is an 45 y.o., male MRN:  [id number] DOB:  1970-05-29 Patient phone:  [phone] (home)  Patient address:   [address] [place] [address],  Total Time spent with patient: Greater than 30 minutes  Date of Admission:  11/13/2015  Date of Discharge: 11-17-15  Reason for Admission: Opioid withdrawal symptoms.  Principal Problem: Opioid use disorder, severe, dependence [facility](HCC)  Discharge Diagnoses: Patient Active Problem List   Diagnosis Date Noted  . Opioid use disorder, severe, dependence (HCC) [F11.20] 11/14/2015    Priority: High  . MDD (major depressive disorder) (HCC) [F32.9] 11/13/2015    Priority: Medium   Past Psychiatric History: Opioid use disorder.  Past Medical History: History reviewed. No pertinent past medical history. History reviewed. No pertinent surgical history.  Family History:  Family History  Problem Relation Age of Onset  . Mental illness Neg Hx    Family Psychiatric  History: See H&P  Social History:  History  Alcohol Use No     History  Drug Use    Comment: opiates    Social History   Social History  . Marital status: Single    Spouse name: N/A  . Number of children: N/A  . Years of education: N/A   Social History Main Topics  . Smoking status: Current Every Day Smoker    Packs/day: 0.50    Years: 20.00    Types: Cigarettes  . Smokeless tobacco: Current User    Types: Snuff  . Alcohol use No  . Drug use:      Comment: opiates  . Sexual activity: Not Asked   Other Topics Concern  . None   Social History Narrative  . None   Hospital Course: This is an admission assessment for Micheal Hurley, a 45 year old AA male with hx of opioid use disorder, chronic. [facility] with complaints suicidal ideations x 2 weeks, crazy thoughts & worsening use of heroin. A review of lab reports indicated elevated liver enzymes (AST  & ALT). During this assessment, Micheal Hurley reports, "The cops took me to the ED yesterday. I was having some depression & other stuff going on. I also need help getting clean from drug use. I use heroin for the last 2 years. There have not been any sobriety time for me & I have not been to any substance abuse treatment. I have been depressed x 1 week, which led to me feeling suicidal, but I'm not feeling like that any more. I don't need treatment for depression. I'm kind of having bad withdrawal symptoms, abdominal cramps, muscle aches, nausea etc.  I will need residential treatment after I'm done here please".  Micheal Hur[facility] with his UDS test results positive for opioid. He did admit to having been using heroin x 2 years without any sobriety time or substance abuse treatment. Although, not currently actively suicidal, he was presenting with opioid withdrawal symptoms & stated that he became suicidal after being depressed x 1 week, possibly substance induced due to the guilt of being a drug addict. He was in need of opioid detoxification treatment.   After admission assessment/ evaluation, Micheal Hurley's presenting symptoms were detected & identified. The medication regimen targeting those symptoms were discussed & initiated. He received Clonidine detoxification treatment protocols to combat the withdrawal symptoms of opioid. He was also medicated & discharged on Sertraline 25 mg  for depression/anxiety, Hydroxyzine 25 mg prn for anxiety, Nicorette gum for smoking cessation & Trazodone 100 mg for insomnia. He was also enrolled & participated in the group counseling sessions being offered & held on this unit. He learned coping skills that should help him further to cope better & manage his depression/substance abuse issues after discharge.   Micheal Hurley has completed detox treatment & his mood stable. This is evidenced by his reports of improved mood, absence of suicidal ideations & substance  withdrawal symptoms. He is currently being discharged to continue further substance abuse treatment at the [facility] here in [place], [place]. He is provided with all the pertinent information needed to make this appointment without problems.  Upon discharge, Micheal Hurley adamantly denies any SIHI, AVH, delusional thoughts, paranoia or substance withdrawal symptoms. He was provided with a 7 days worth, supply samples of his [facility] discharge medications. He left [facility] with all personal belongings in no apparent distress. Transportation per city bus. [facility] assisted with bus pass.  Physical Findings: AIMS:  , ,  ,  ,    CIWA:  CIWA-Ar Total: 1 COWS:  COWS Total Score: 0  Musculoskeletal: Strength & Muscle Tone: within normal limits Gait & Station: normal Patient leans: N/A  Psychiatric Specialty Exam: Physical Exam  Constitutional: He appears well-developed.  HENT:  Head: Normocephalic.  Eyes: Pupils are equal, round, and reactive to light.  Neck: Normal range of motion.  Cardiovascular: Normal rate.   Respiratory: Effort normal.  GI: Soft.  Genitourinary:  Genitourinary Comments: Denies any issues  Musculoskeletal: Normal range of motion.  Neurological: He is alert.  Skin: Skin is warm.    Review of Systems  Constitutional: Negative.   HENT: Negative.   Eyes: Negative.   Respiratory: Negative.   Cardiovascular: Negative.   Gastrointestinal: Negative.   Genitourinary: Negative.   Musculoskeletal: Negative.   Skin: Negative.   Neurological: Negative.   Endo/Heme/Allergies: Negative.   Psychiatric/Behavioral: Positive for substance abuse (Opioid use disorder). Negative for hallucinations, memory loss and suicidal ideas. The patient has insomnia (Stable). The patient is not nervous/anxious.     Blood pressure (!) 146/94, pulse 79, temperature 98.3 F (36.8 C), temperature source Oral, resp. rate 20, height 5\' 7"  (1.702 m), weight 86.2 kg (190 lb).Body mass index is 29.76 kg/m.  See H&P   Have  you used any form of tobacco in the last 30 days? (Cigarettes, Smokeless Tobacco, Cigars, and/or Pipes): Yes  Has this patient used any form of tobacco in the last 30 days? (Cigarettes, Smokeless Tobacco, Cigars, and/or Pipes): Yes, provided with a nicotine gum prescription.  Blood Alcohol level:  Lab Results  Component Value Date   ETH <5 11/13/2015   Metabolic Disorder Labs:  No results found for: HGBA1C, MPG No results found for: PROLACTIN Lab Results  Component Value Date   CHOL 160 11/05/2008   TRIG 253 (H) 11/05/2008   HDL 27 (L) 11/05/2008   CHOLHDL 5.9 Ratio 11/05/2008   VLDL 51 (H) 11/05/2008   LDLCALC 82 11/05/2008   LDLCALC 77 10/30/2007   See Psychiatric Specialty Exam and Suicide Risk Assessment completed by Attending Physician prior to discharge.  Discharge destination:  Home  Is patient on multiple antipsychotic therapies at discharge:  No   Has Patient had three or more failed trials of antipsychotic monotherapy by history:  No  Recommended Plan for Multiple Antipsychotic Therapies: NA    Medication List    TAKE these medications     Indication  cloNIDine 0.1 MG  tablet Commonly known as:  CATAPRES Take 1 tablet (0.1 mg total) by mouth daily before breakfast.  Indication:  High Blood Pressure Disorder   hydrOXYzine 25 MG tablet Commonly known as:  ATARAX/VISTARIL Take 1 tablet (25 mg total) by mouth every 6 (six) hours as needed for anxiety.  Indication:  Anxiety Neurosis   nicotine polacrilex 2 MG gum Commonly known as:  NICORETTE Take 1 each (2 mg total) by mouth as needed for smoking cessation.  Indication:  Nicotine Addiction   sertraline 25 MG tablet Commonly known as:  ZOLOFT Take 1 tablet (25 mg total) by mouth daily. For depression  Indication:  Major Depressive Disorder   traZODone 100 MG tablet Commonly known as:  DESYREL Take 1 tablet (100 mg total) by mouth at bedtime as needed for sleep.  Indication:  Trouble Sleeping       Follow-up Information    [facility] .   Specialty:  Behavioral Health Why:  Walk in clinic on Mondays, Wednesdays, and Fridays at 12:30 for assessment for therapy and medication management services.  Contact information: [address] [place] [address] [phone]          Follow-up recommendations: Activity:  As tolerated Diet: As recommended by your primary care doctor. Keep all scheduled follow-up appointments as recommended.    Comments: Patient is instructed prior to discharge to: Take all medications as prescribed by his/her mental healthcare provider. Report any adverse effects and or reactions from the medicines to his/her outpatient provider promptly. Patient has been instructed & cautioned: To not engage in alcohol and or illegal drug use while on prescription medicines. In the event of worsening symptoms, patient is instructed to call the crisis hotline, 911 and or go to the nearest ED for appropriate evaluation and treatment of symptoms. To follow-up with his/her primary care provider for your other medical issues, concerns and or health care needs.   Signed: Sanjuana KavaNwoko, Caliann Leckrone I, NP, PMHNP, FNP-BC 11/18/2015, 4:26 PM

## 2015-11-17 NOTE — Tx Team (Signed)
Interdisciplinary Treatment Plan Update (Adult) Date: 11/17/2015   Time Reviewed: 9:30 AM  Progress in Treatment: Attending groups: Yes Participating in groups: Minimally Taking medication as prescribed: Yes Tolerating medication: Yes Family/Significant other contact made: No, patient has declined collateral contact Patient understands diagnosis: Yes Discussing patient identified problems/goals with staff: Yes Medical problems stabilized or resolved: Yes Denies suicidal/homicidal ideation: Yes Issues/concerns per patient self-inventory: Yes Other:  New problem(s) identified: N/A  Discharge Plan or Barriers: Patient plans to return home to follow up with outpatient services.    Reason for Continuation of Hospitalization:  Depression Anxiety Medication Stabilization   Comments: N/A  Estimated length of stay: Discharge anticipated for today 11/17/15  Patient is a 45yo male admitted to the hospital with thoughts of self-harm with a plan to jump off a bridge near his home and ongoing Heroin use (1 gram daily) for the last two years and reports primary trigger for admission was financial issues from missing days at work, "giving up" after attending AA/NA for the last few months but "it isn't working," and stress with fiance who wants him to stop using drugs.  Patient will benefit from crisis stabilization, medication evaluation, group therapy and psychoeducation, in addition to case management for discharge planning. At discharge it is recommended that Patient adhere to the established discharge plan and continue in treatment.   Review of initial/current patient goals per problem list:  1. Goal(s): Patient will participate in aftercare plan   Met: Yes   Target date: 3-5 days post admission date   As evidenced by: Patient will participate within aftercare plan AEB aftercare provider and housing plan at discharge being identified.  8/22: Goal met. Patient plans to return home  to follow up with outpatient services.   2. Goal (s): Patient will exhibit decreased depressive symptoms and suicidal ideations.   Met: Adequate for discharge per MD   Target date: 3-5 days post admission date   As evidenced by: Patient will utilize self rating of depression at 3 or below and demonstrate decreased signs of depression or be deemed stable for discharge by MD.  8/22: Adequate for discharge per MD. Patient reports improvement in his or her symptoms and reports feeling safe for discharge.     3. Goal(s): Patient will demonstrate decreased signs and symptoms of anxiety.   Met: Adequate for discharge per MD   Target date: 3-5 days post admission date   As evidenced by: Patient will utilize self rating of anxiety at 3 or below and demonstrated decreased signs of anxiety, or be deemed stable for discharge by MD  8/22: Adequate for discharge per MD. Patient reports improvement in his or her symptoms and reports feeling safe for discharge.      4. Goal(s): Patient will demonstrate decreased signs of withdrawal due to substance abuse   Met: Yes   Target date: 3-5 days post admission date   As evidenced by: Patient will produce a CIWA/COWS score of 0, have stable vitals signs, and no symptoms of withdrawal  8/22: Goal met. No withdrawal symptoms reported at this time per medical chart.  Attendees: Patient:    Family:    Physician: Dr. Modesta Messing 11/17/2015 9:30 AM  Nursing: Elesa Massed, Eulogio Bear , RN 11/17/2015 9:30 AM  Clinical Social Worker: Tilden Fossa, LCSW 11/17/2015 9:30 AM  Other: Peri Maris, LCSW 11/17/2015 9:30 AM  Other:  11/17/2015 9:30 AM  Other:  11/17/2015 9:30 AM  Other:  May Malachi Carl, NP 11/17/2015 9:30 AM  Other:    Other:     Scribe for Treatment Team:  Tilden Fossa, [place]

## 2017-07-17 ENCOUNTER — Encounter (HOSPITAL_COMMUNITY): Payer: Self-pay | Admitting: Emergency Medicine

## 2017-07-17 ENCOUNTER — Emergency Department (HOSPITAL_COMMUNITY): Payer: Self-pay

## 2017-07-17 ENCOUNTER — Other Ambulatory Visit: Payer: Self-pay

## 2017-07-17 ENCOUNTER — Emergency Department (HOSPITAL_COMMUNITY)
Admission: EM | Admit: 2017-07-17 | Discharge: 2017-07-18 | Disposition: A | Discharge status: Home / Self Care | Payer: Self-pay | Attending: Emergency Medicine | Admitting: Emergency Medicine

## 2017-07-17 ENCOUNTER — Ambulatory Visit (HOSPITAL_COMMUNITY)
Admission: EM | Admit: 2017-07-17 | Discharge: 2017-07-17 | Disposition: A | Discharge status: Home / Self Care | Payer: Self-pay

## 2017-07-17 DIAGNOSIS — Z87891 Personal history of nicotine dependence: Secondary | ICD-10-CM | POA: Insufficient documentation

## 2017-07-17 DIAGNOSIS — Y990 Civilian activity done for income or pay: Secondary | ICD-10-CM | POA: Insufficient documentation

## 2017-07-17 DIAGNOSIS — Y939 Activity, unspecified: Secondary | ICD-10-CM | POA: Insufficient documentation

## 2017-07-17 DIAGNOSIS — Z79899 Other long term (current) drug therapy: Secondary | ICD-10-CM | POA: Insufficient documentation

## 2017-07-17 DIAGNOSIS — W298XXA Contact with other powered powered hand tools and household machinery, initial encounter: Secondary | ICD-10-CM | POA: Insufficient documentation

## 2017-07-17 DIAGNOSIS — Y929 Unspecified place or not applicable: Secondary | ICD-10-CM | POA: Insufficient documentation

## 2017-07-17 DIAGNOSIS — S62355A Nondisplaced fracture of shaft of fourth metacarpal bone, left hand, initial encounter for closed fracture: Secondary | ICD-10-CM | POA: Insufficient documentation

## 2017-07-17 NOTE — ED Triage Notes (Signed)
Patient to ED c/o L hand injury - states he was holding a hammer drill around noon today and it spun, catching his fingers. Patient has swelling to dorsum of hand, decreased ROM d/t pain, and tingling to his fingers. Pulses equal.

## 2017-07-18 MED ORDER — OXYCODONE-ACETAMINOPHEN 5-325 MG PO TABS: 1.0000 | ORAL_TABLET | Freq: Three times a day (TID) | ORAL | 0 refills | Status: DC | PRN

## 2017-07-18 MED ORDER — NAPROXEN 500 MG PO TABS: 500.0000 mg | ORAL_TABLET | Freq: Two times a day (BID) | ORAL | 0 refills | Status: DC

## 2017-07-18 NOTE — ED Notes (Signed)
Fast track pt, see PA's assessment for details.

## 2017-07-18 NOTE — ED Provider Notes (Signed)
[facility] Provider Note   CSN: [id number] Arrival date & time: 07/17/17  2137     History   Chief Complaint Chief Complaint  Patient presents with  . Hand Injury    HPI Micheal Hurley is a 47 y.o. male with a past medical history of opioid use disorder, who presents to ED for evaluation of nondominant left hand injury.  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  He reports pain on his left fourth digit metacarpal area on the dorsum of his hand.  Denies any prior fracture, dislocations or procedures in the area.  He reports pain worse with palpation.  Denies any changes in sensation, wounds.  HPI  History reviewed. No pertinent past medical history.  Patient Active Problem List   Diagnosis Date Noted  . Opioid use disorder, severe, dependence (HCC) 11/14/2015  . MDD (major depressive disorder) 11/13/2015    History reviewed. No pertinent surgical history.      Home Medications    Prior to Admission medications   Medication Sig Start Date End Date Taking? Authorizing Provider  cloNIDine (CATAPRES) 0.1 MG tablet Take 1 tablet (0.1 mg total) by mouth daily before breakfast. 11/18/15   Armandina StammerNwoko, Agnes I, NP  hydrOXYzine (ATARAX/VISTARIL) 25 MG tablet Take 1 tablet (25 mg total) by mouth every 6 (six) hours as needed for anxiety. 11/17/15   Armandina StammerNwoko, Agnes I, NP  naproxen (NAPROSYN) 500 MG tablet Take 1 tablet (500 mg total) by mouth 2 (two) times daily. 07/18/17   Orianna Biskup, PA-C  nicotine polacrilex (NICORETTE) 2 MG gum Take 1 each (2 mg total) by mouth as needed for smoking cessation. 11/17/15   Armandina StammerNwoko, Agnes I, NP  sertraline (ZOLOFT) 25 MG tablet Take 1 tablet (25 mg total) by mouth daily. For depression 11/17/15   Armandina StammerNwoko, Agnes I, NP  traZODone (DESYREL) 100 MG tablet Take 1 tablet (100 mg total) by mouth at bedtime as needed for sleep. 11/17/15   Sanjuana KavaNwoko, Agnes I, NP    Family  History Family History  Problem Relation Age of Onset  . Mental illness Neg Hx     Social History Social History   Tobacco Use  . Smoking status: Former Smoker    Packs/day: 0.50    Years: 20.00    Pack years: 10.00    Types: Cigarettes    Last attempt to quit: 07/30/2016    Years since quitting: 0.9  . Smokeless tobacco: Current User    Types: Snuff  Substance Use Topics  . Alcohol use: No    Comment: history  . Drug use: Yes    Comment: opiates - quit  August 2017     Allergies   Bee venom   Review of Systems Review of Systems  Constitutional: Negative for chills and fever.  Musculoskeletal: Positive for arthralgias. Negative for gait problem, joint swelling, myalgias, neck pain and neck stiffness.  Skin: Negative for wound.  Neurological: Negative for weakness and numbness.     Physical Exam Updated Vital Signs BP (!) 151/82 (BP Location: Right Arm)   Pulse 60   Temp 98.6 F (37 C) (Oral)   Resp 18   Ht 5' 7.5" (1.715 m)   Wt 97.5 kg (215 lb)   SpO2 98%   BMI 33.18 kg/m   Physical Exam  Constitutional: He appears well-developed and well-nourished. No distress.  Nontoxic appearing and in no acute distress.  HENT:  Head: Normocephalic and atraumatic.  Eyes: Conjunctivae and EOM are normal. No scleral icterus.  Neck: Normal range of motion.  Pulmonary/Chest: Effort normal. No respiratory distress.  Musculoskeletal: Normal range of motion. He exhibits edema and tenderness. He exhibits no deformity.  Edema and tenderness to palpation over the area of the shaft of the fourth metacarpal of the left hand.  2+ radial pulse noted.  Normal sensation noted.  Able to perform passive and active range of motion of digits without difficulty.  No erythema or warmth of joints noted.  Neurological: He is alert.  Skin: No rash noted. He is not diaphoretic.  Psychiatric: He has a normal mood and affect.  Nursing note and vitals reviewed.    ED Treatments / Results   Labs (all labs ordered are listed, but only abnormal results are displayed) Labs Reviewed - No data to display  EKG None  Radiology Dg Hand Complete Left  Result Date: 07/17/2017 CLINICAL DATA:  Injury to the left hand while holding hammer drill, with dorsal hand swelling and limited range of motion. Initial encounter. EXAM: LEFT HAND - COMPLETE 3+ VIEW COMPARISON:  None. FINDINGS: There is a mildly displaced oblique fracture through the midshaft of the fourth metacarpal, with overlying soft tissue swelling. No additional fractures are seen. There is no evidence of intra-articular extension. The joint spaces are preserved. The carpal rows are intact, and demonstrate normal alignment. IMPRESSION: Mildly displaced oblique fracture through the midshaft of the fourth metacarpal. Electronically Signed   By: Roanna Raider M.D.   On: 07/17/2017 22:32    Procedures .Splint Application Date/Time: 07/18/2017 12:13 AM Performed by: Dietrich Pates, PA-C Authorized by: Dietrich Pates, PA-C   Consent:    Consent obtained:  Verbal   Consent given by:  Patient   Risks discussed:  Discoloration, numbness, pain and swelling Pre-procedure details:    Sensation:  Normal Procedure details:    Laterality:  Left   Location:  Hand   Hand:  L hand   Splint type:  Ulnar gutter Post-procedure details:    Pain:  Unchanged   Sensation:  Normal   Patient tolerance of procedure:  Tolerated well, no immediate complications   (including critical care time)  Medications Ordered in ED Medications - No data to display   Initial Impression / Assessment and Plan / ED Course  I have reviewed the triage vital signs and the nursing notes.  Pertinent labs & imaging results that were available during my care of the patient were reviewed by me and considered in my medical decision making (see chart for details).     Patient presents to ED for evaluation of nondominant left hand injury that occurred several hours  prior to arrival.  He was reaching for a drill when he injured his hand.  X-ray shows mildly displaced oblique fracture through the midshaft of the fourth metacarpal.  This is a closed fracture.  The area is neurovascularly intact.  Will apply ulnar gutter splint and advised anti-inflammatories as well as following up with orthopedics for further evaluation.  Patient has a history of opiate abuse so will not prescribe narcotics.  Advised to return for any severe worsening symptoms.  Portions of this note were generated with Scientist, clinical (histocompatibility and immunogenetics). Dictation errors may occur despite best attempts at proofreading.   Final Clinical Impressions(s) / ED Diagnoses   Final diagnoses:  Closed nondisplaced fracture of shaft of fourth metacarpal bone of left hand, initial encounter    ED Discharge  Orders        Ordered    oxyCODONE-acetaminophen (PERCOCET/ROXICET) 5-325 MG tablet  Every 8 hours PRN,   Status:  Discontinued     07/18/17 0009    naproxen (NAPROSYN) 500 MG tablet  2 times daily,   Status:  Discontinued     07/18/17 0009    naproxen (NAPROSYN) 500 MG tablet  2 times daily     07/18/17 0010       Dietrich Pates, PA-C 07/18/17 0014    Devoria Albe, MD 07/18/17 [id number]

## 2017-07-18 NOTE — ED Notes (Signed)
Paged Otho  

## 2018-06-07 ENCOUNTER — Encounter (HOSPITAL_COMMUNITY): Payer: Self-pay | Admitting: Physician Assistant

## 2018-06-07 ENCOUNTER — Emergency Department (HOSPITAL_COMMUNITY)
Admission: EM | Admit: 2018-06-07 | Discharge: 2018-06-07 | Disposition: A | Discharge status: Home / Self Care | Payer: BLUE CROSS/BLUE SHIELD | Attending: Emergency Medicine | Admitting: Emergency Medicine

## 2018-06-07 ENCOUNTER — Emergency Department (HOSPITAL_COMMUNITY): Payer: BLUE CROSS/BLUE SHIELD

## 2018-06-07 DIAGNOSIS — Y999 Unspecified external cause status: Secondary | ICD-10-CM | POA: Insufficient documentation

## 2018-06-07 DIAGNOSIS — Y929 Unspecified place or not applicable: Secondary | ICD-10-CM | POA: Insufficient documentation

## 2018-06-07 DIAGNOSIS — X58XXXA Exposure to other specified factors, initial encounter: Secondary | ICD-10-CM | POA: Insufficient documentation

## 2018-06-07 DIAGNOSIS — Y939 Activity, unspecified: Secondary | ICD-10-CM | POA: Insufficient documentation

## 2018-06-07 DIAGNOSIS — S46912A Strain of unspecified muscle, fascia and tendon at shoulder and upper arm level, left arm, initial encounter: Secondary | ICD-10-CM | POA: Diagnosis present

## 2018-06-07 DIAGNOSIS — R52 Pain, unspecified: Secondary | ICD-10-CM

## 2018-06-07 NOTE — ED Triage Notes (Signed)
See downtime paperwork.

## 2018-06-07 NOTE — Discharge Instructions (Signed)
Please read attached information. If you experience any new or worsening signs or symptoms please return to the emergency room for evaluation. Please follow-up with your primary care provider or specialist as discussed. Please use medication prescribed only as directed and discontinue taking if you have any concerning signs or symptoms.   °

## 2018-06-07 NOTE — ED Notes (Signed)
Patient verbalizes understanding of discharge instructions. Opportunity for questioning and answers were provided. Armband removed by staff, pt discharged from ED. Ambulated out to lobby  

## 2018-06-07 NOTE — ED Provider Notes (Signed)
[facility] EMERGENCY DEPARTMENT Provider Note   CSN: [id number] Arrival date & time: 06/07/18  0900    History   Chief Complaint No chief complaint on file.   HPI Micheal Hurley is a 48 y.o. male.     HPI    48 year old male presents today with complaints of left-sided shoulder pain.  Patient notes a history of the same.  Patient reports several months ago he hurt his left shoulder lifting weights.  Ports symptoms improved but often has minor discomfort in the shoulder.  Patient notes that he was doing a minor activity yesterday when he felt a pain in the left superior shoulder.  He denies any distal neurological deficits, reports pain was worse at night.  No medications prior to arrival today.  No other injuries to note.   History reviewed. No pertinent past medical history.  There are no active problems to display for this patient.   History reviewed. No pertinent surgical history.      Home Medications    Prior to Admission medications   Not on File    Family History History reviewed. No pertinent family history.  Social History Social History   Tobacco Use  . Smoking status: Not on file  Substance Use Topics  . Alcohol use: Not on file  . Drug use: Not on file     Allergies   Patient has no allergy information on record.   Review of Systems Review of Systems  All other systems reviewed and are negative.    Physical Exam Updated Vital Signs There were no vitals taken for this visit.  Physical Exam Vitals signs and nursing note reviewed.  Constitutional:      Appearance: He is well-developed.  HENT:     Head: Normocephalic and atraumatic.  Eyes:     General: No scleral icterus.       Right eye: No discharge.        Left eye: No discharge.     Conjunctiva/sclera: Conjunctivae normal.     Pupils: Pupils are equal, round, and reactive to light.  Neck:     Musculoskeletal: Normal range of motion.     Vascular: No JVD.      Trachea: No tracheal deviation.  Pulmonary:     Effort: Pulmonary effort is normal.     Breath sounds: No stridor.  Musculoskeletal:     Comments: Left shoulder atraumatic no swelling or edema, tenderness palpation of the superior anterior portion of the shoulder, extension and external rotation worsening of symptoms, grip strength 5/5 sensation intact  Neurological:     Mental Status: He is alert and oriented to person, place, and time.     Coordination: Coordination normal.  Psychiatric:        Behavior: Behavior normal.        Thought Content: Thought content normal.        Judgment: Judgment normal.      ED Treatments / Results  Labs (all labs ordered are listed, but only abnormal results are displayed) Labs Reviewed - No data to display  EKG None  Radiology No results found.  Procedures Procedures (including critical care time)  Medications Ordered in ED Medications - No data to display   Initial Impression / Assessment and Plan / ED Course  I have reviewed the triage vital signs and the nursing notes.  Pertinent labs & imaging results that were available during my care of the patient were reviewed by me and considered in  my medical decision making (see chart for details).        48 year old male presents today with likely rotator cuff strain.  Patient well-appearing in no acute distress.  He will be discharged with outpatient follow-up and strict return precautions.  Verbalized understanding and agreement to today's plan.  Final Clinical Impressions(s) / ED Diagnoses   Final diagnoses:  Strain of left shoulder, initial encounter    ED Discharge Orders    None       Rosalio Loud 06/07/18 1246    Sabas Sous, MD 06/09/18 [phone]

## 2018-06-08 ENCOUNTER — Encounter (HOSPITAL_COMMUNITY): Payer: Self-pay | Admitting: Emergency Medicine

## 2018-10-31 ENCOUNTER — Other Ambulatory Visit: Payer: Self-pay

## 2018-10-31 DIAGNOSIS — Z20822 Contact with and (suspected) exposure to covid-19: Secondary | ICD-10-CM

## 2018-11-01 LAB — NOVEL CORONAVIRUS, NAA: SARS-CoV-2, NAA: NOT DETECTED

## 2019-03-02 ENCOUNTER — Other Ambulatory Visit: Payer: Self-pay

## 2019-03-02 DIAGNOSIS — Z20822 Contact with and (suspected) exposure to covid-19: Secondary | ICD-10-CM

## 2019-03-05 LAB — NOVEL CORONAVIRUS, NAA: SARS-CoV-2, NAA: NOT DETECTED

## 2019-06-01 IMAGING — CR DG HAND COMPLETE 3+V*L*
3 series · 3 of 3 positions shown · non-contrast
Comparison: None.

CLINICAL DATA: Injury to the left hand while [place],
with dorsal hand swelling and limited range of motion. Initial
encounter.

EXAM:
LEFT HAND - COMPLETE 3+ VIEW

[hand pa]
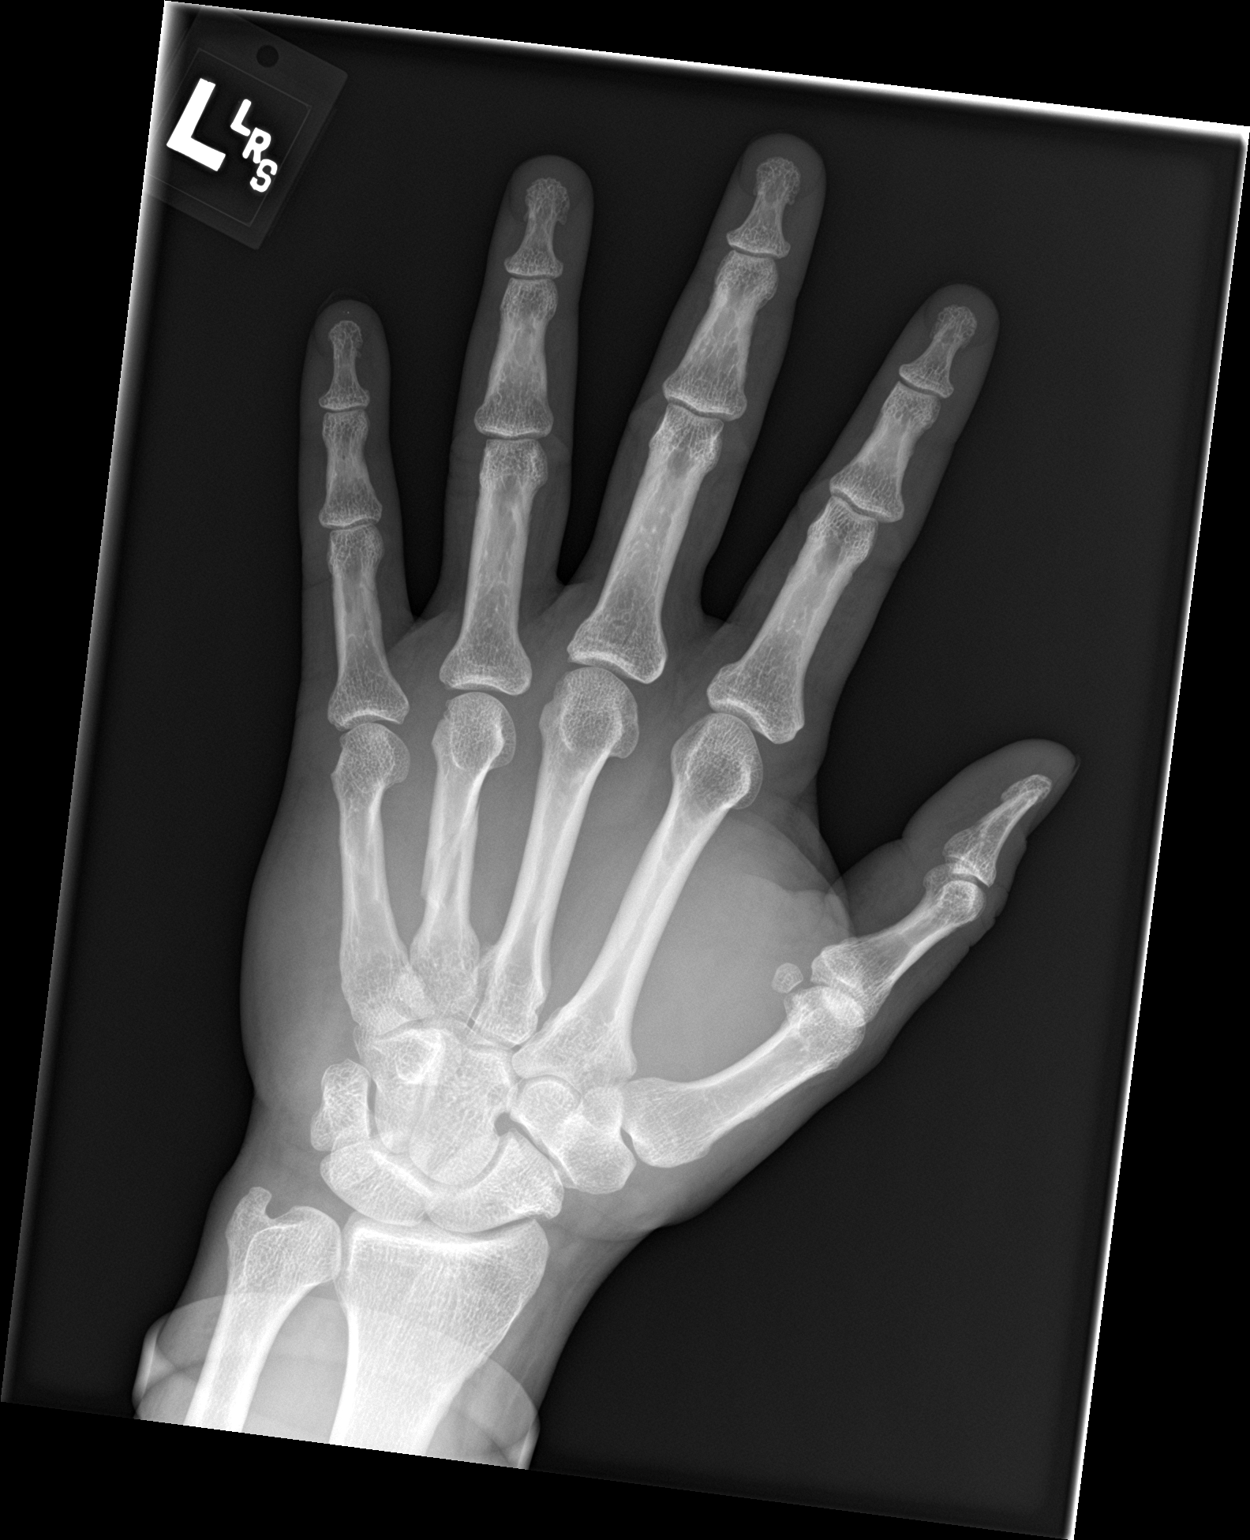

[hand obl]
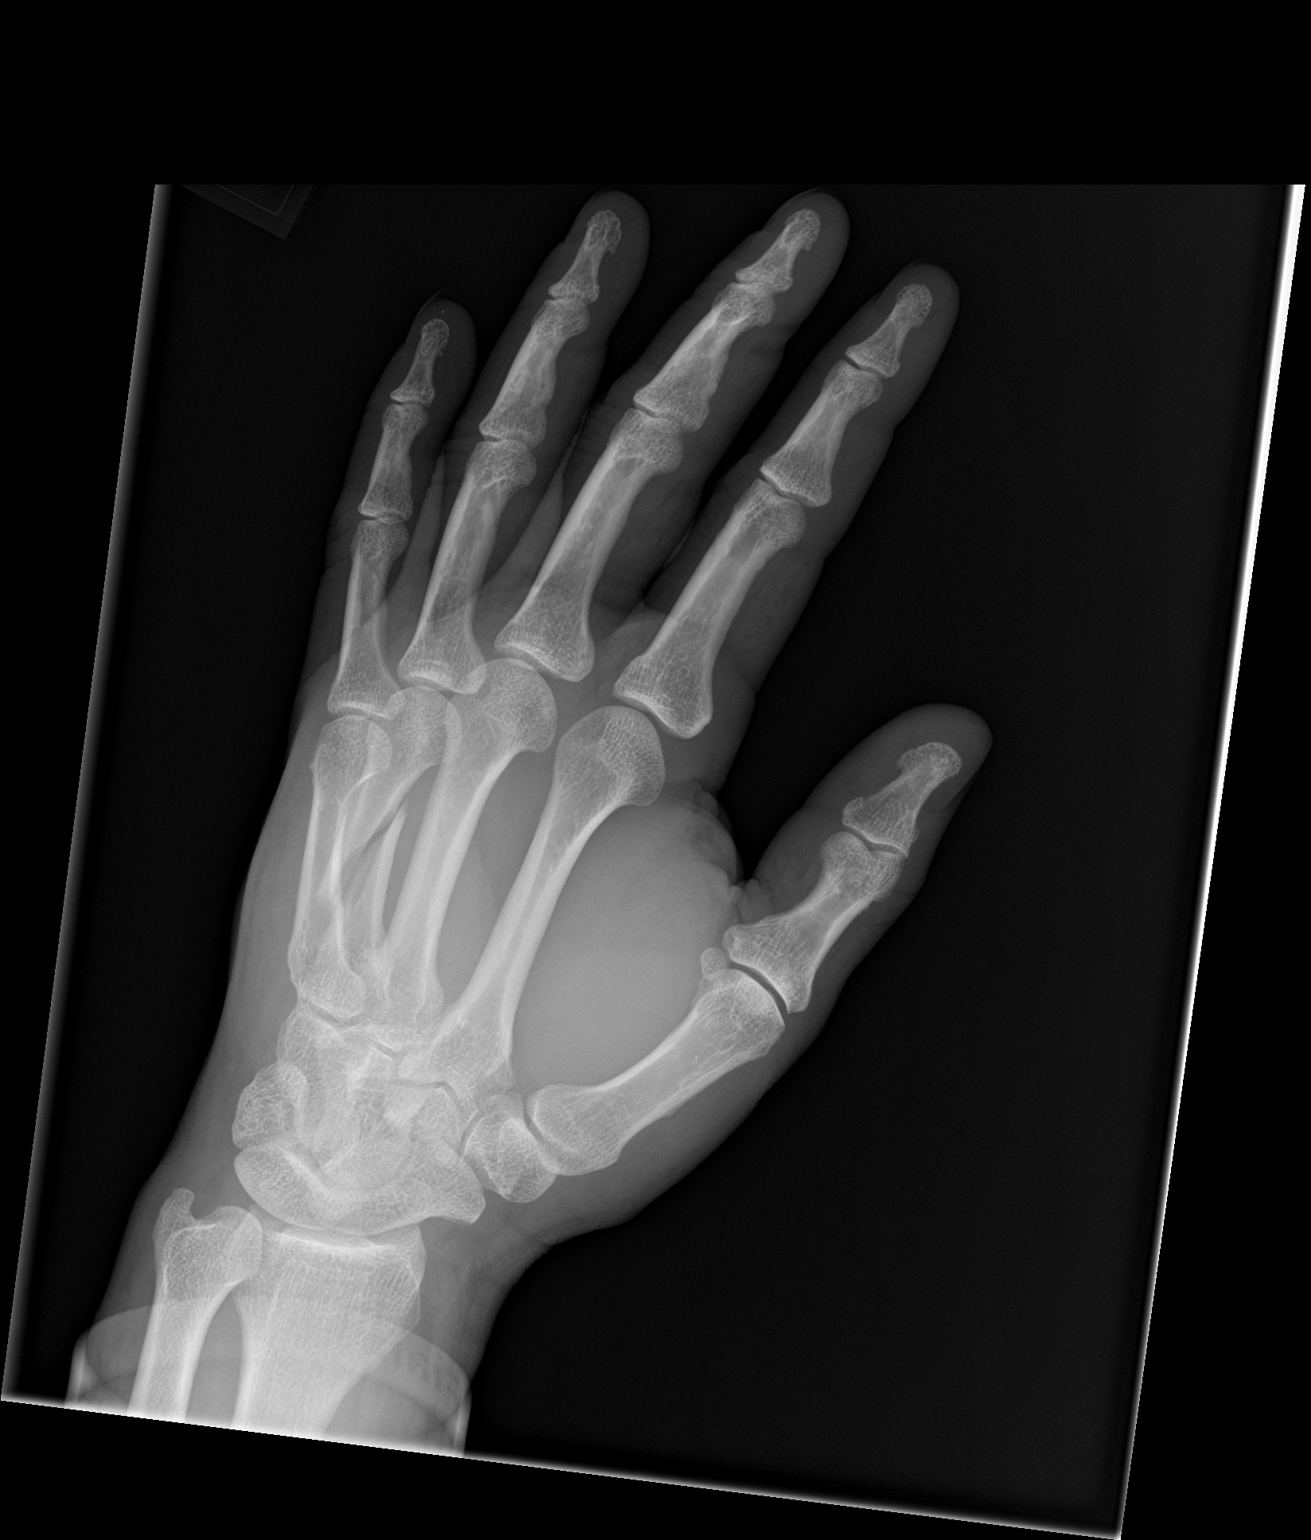

[hand lat]
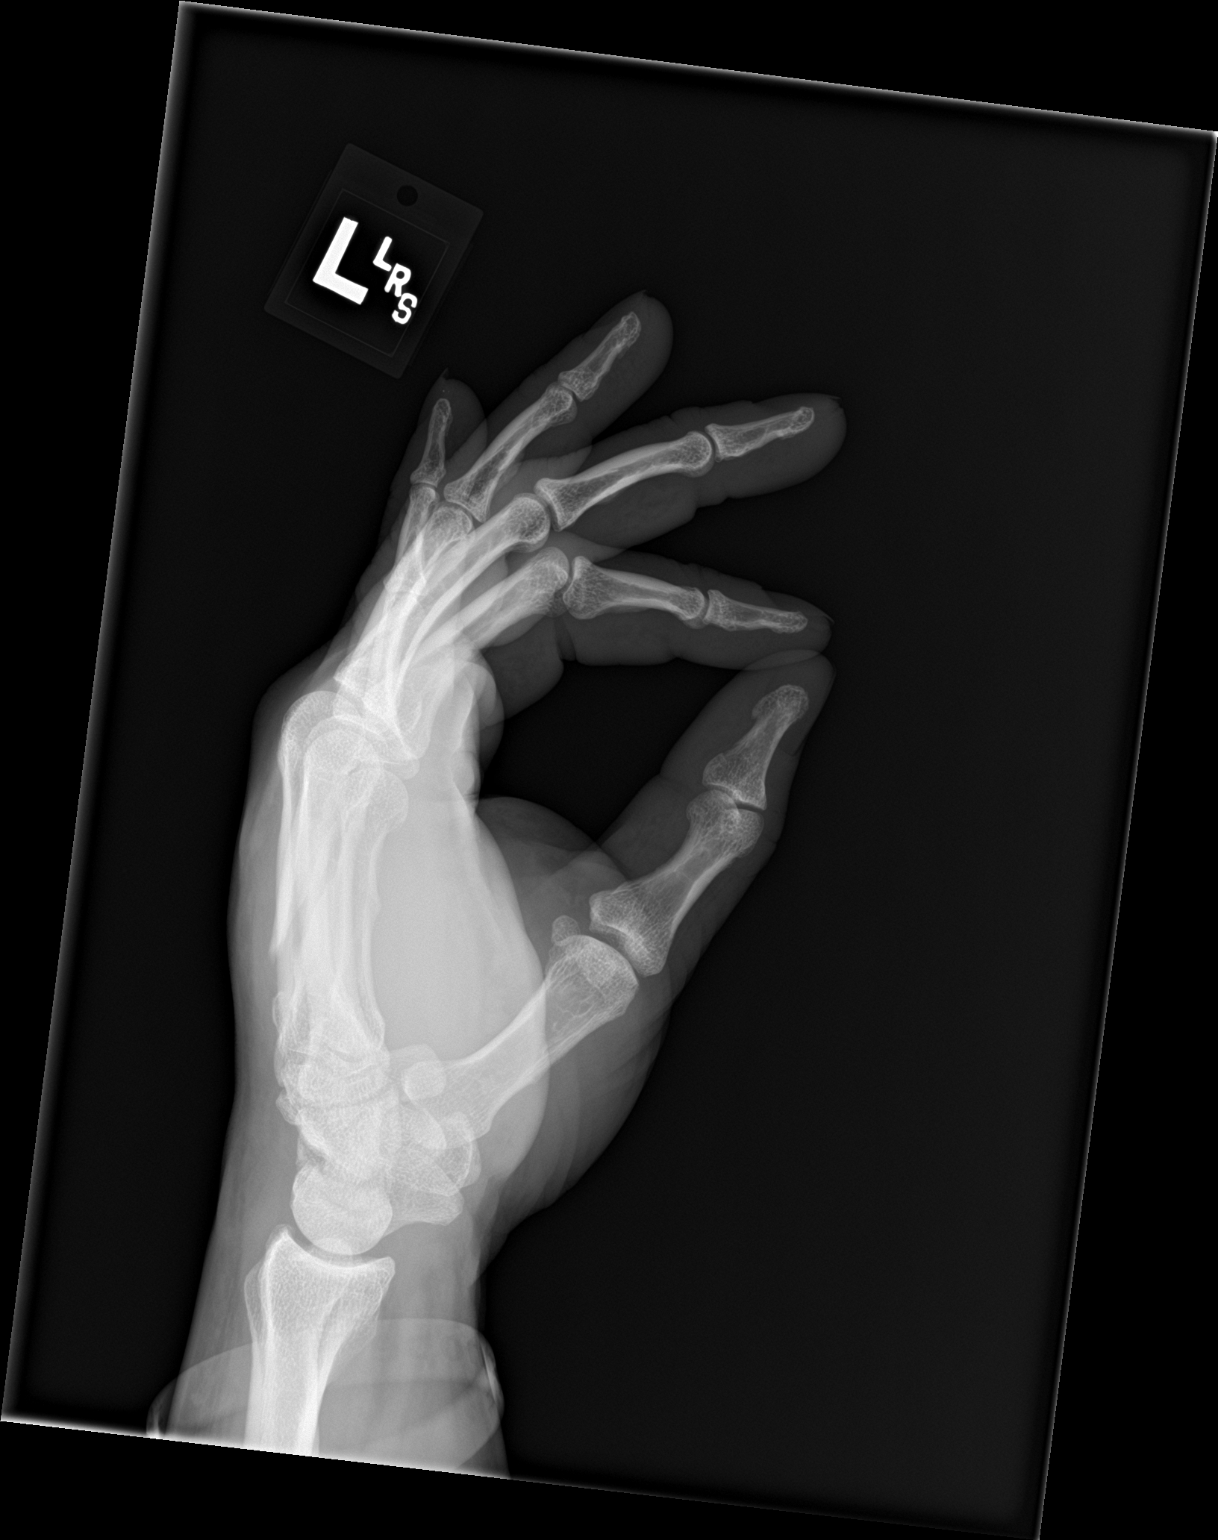

[3 of 3 positions shown; findings below may reference images not displayed]

FINDINGS: There is a mildly displaced oblique fracture through the midshaft of
the fourth metacarpal, with overlying soft tissue swelling. No
additional fractures are seen. There is no evidence of
intra-articular extension.

The joint spaces are preserved. The carpal rows are intact, and
demonstrate normal alignment.
IMPRESSION: Mildly displaced oblique fracture through the midshaft of the fourth
metacarpal.

## 2019-06-13 ENCOUNTER — Ambulatory Visit: Payer: Self-pay | Attending: Internal Medicine

## 2019-06-13 DIAGNOSIS — Z23 Encounter for immunization: Secondary | ICD-10-CM

## 2019-06-13 NOTE — Progress Notes (Signed)
   Covid-19 Vaccination Clinic  Name:  Abdiaziz Klahn    MRN: [id number] DOB: July 23, 1970  06/13/2019  Mr. Choung was observed post Covid-19 immunization for 15 minutes without incident. He was provided with Vaccine Information Sheet and instruction to access the V-Safe system.   Mr. Fleig was instructed to call 911 with any severe reactions post vaccine: Marland Kitchen Difficulty breathing  . Swelling of face and throat  . A fast heartbeat  . A bad rash all over body  . Dizziness and weakness   Immunizations Administered    Name Date Dose VIS Date Route   Pfizer COVID-19 Vaccine 06/13/2019  3:34 PM 0.3 mL 03/08/2019 Intramuscular   Manufacturer: [employer], [employer]   Lot: [id number]   NDC: [address]-0

## 2019-07-09 ENCOUNTER — Ambulatory Visit: Payer: Self-pay | Attending: Internal Medicine

## 2019-07-09 DIAGNOSIS — Z23 Encounter for immunization: Secondary | ICD-10-CM

## 2019-07-09 NOTE — Progress Notes (Signed)
   Covid-19 Vaccination Clinic  Name:  Micheal Hurley    MRN: [id number] DOB: 01/17/1971  07/09/2019  Mr. Micheal Hurley was observed post Covid-19 immunization for 15 minutes without incident. He was provided with Vaccine Information Sheet and instruction to access the V-Safe system.   Mr. Micheal Hurley was instructed to call 911 with any severe reactions post vaccine: Marland Kitchen Difficulty breathing  . Swelling of face and throat  . A fast heartbeat  . A bad rash all over body  . Dizziness and weakness   Immunizations Administered    Name Date Dose VIS Date Route   Pfizer COVID-19 Vaccine 07/09/2019  4:33 PM 0.3 mL 03/08/2019 Intramuscular   Manufacturer: [employer], [employer]   Lot: [id number]   NDC: [address]-0

## 2019-09-06 ENCOUNTER — Encounter (HOSPITAL_COMMUNITY): Payer: Self-pay | Admitting: Emergency Medicine

## 2019-09-06 ENCOUNTER — Other Ambulatory Visit: Payer: Self-pay

## 2019-09-06 ENCOUNTER — Emergency Department (HOSPITAL_COMMUNITY)
Admission: EM | Admit: 2019-09-06 | Discharge: 2019-09-06 | Disposition: A | Discharge status: Home / Self Care | Payer: Self-pay | Attending: Emergency Medicine | Admitting: Emergency Medicine

## 2019-09-06 DIAGNOSIS — W57XXXA Bitten or stung by nonvenomous insect and other nonvenomous arthropods, initial encounter: Secondary | ICD-10-CM | POA: Insufficient documentation

## 2019-09-06 DIAGNOSIS — Y999 Unspecified external cause status: Secondary | ICD-10-CM | POA: Insufficient documentation

## 2019-09-06 DIAGNOSIS — Y939 Activity, unspecified: Secondary | ICD-10-CM | POA: Insufficient documentation

## 2019-09-06 DIAGNOSIS — M7989 Other specified soft tissue disorders: Secondary | ICD-10-CM

## 2019-09-06 DIAGNOSIS — R2231 Localized swelling, mass and lump, right upper limb: Secondary | ICD-10-CM | POA: Insufficient documentation

## 2019-09-06 DIAGNOSIS — Z87891 Personal history of nicotine dependence: Secondary | ICD-10-CM | POA: Insufficient documentation

## 2019-09-06 DIAGNOSIS — Y9289 Other specified places as the place of occurrence of the external cause: Secondary | ICD-10-CM | POA: Insufficient documentation

## 2019-09-06 DIAGNOSIS — T7840XA Allergy, unspecified, initial encounter: Secondary | ICD-10-CM | POA: Insufficient documentation

## 2019-09-06 DIAGNOSIS — Z79899 Other long term (current) drug therapy: Secondary | ICD-10-CM | POA: Insufficient documentation

## 2019-09-06 DIAGNOSIS — T63461A Toxic effect of venom of wasps, accidental (unintentional), initial encounter: Secondary | ICD-10-CM

## 2019-09-06 DIAGNOSIS — S60561A Insect bite (nonvenomous) of right hand, initial encounter: Secondary | ICD-10-CM | POA: Insufficient documentation

## 2019-09-06 MED ORDER — METHYLPREDNISOLONE SODIUM SUCC 125 MG IJ SOLR
125.0000 mg | Freq: Once | INTRAMUSCULAR | Status: AC
  Administered 2019-09-06: 125 mg via INTRAVENOUS
  Filled 2019-09-06: qty 2

## 2019-09-06 MED ORDER — DIPHENHYDRAMINE HCL 50 MG/ML IJ SOLN
50.0000 mg | Freq: Once | INTRAMUSCULAR | Status: AC
  Administered 2019-09-06: 50 mg via INTRAVENOUS
  Filled 2019-09-06: qty 1

## 2019-09-06 MED ORDER — SODIUM CHLORIDE 0.9 % IV SOLN
40.0000 mg | Freq: Once | INTRAVENOUS | Status: AC
  Administered 2019-09-06: 40 mg via INTRAVENOUS
  Filled 2019-09-06: qty 4

## 2019-09-06 MED ORDER — EPINEPHRINE 0.3 MG/0.3ML IJ SOAJ: 0.3000 mg | INTRAMUSCULAR | 0 refills | Status: AC | PRN

## 2019-09-06 NOTE — ED Notes (Signed)
No acute distress noted.ambulatory with family member upon discharge

## 2019-09-06 NOTE — Discharge Instructions (Addendum)
Per our discussion, I am prescribing you an EpiPen.  Please use good Rx to find the pharmacy with the most affordable option for filling this.  Please keep the EpiPen on you at all times.  If you develop any new or worsening symptoms please call 911 or immediately return to the emergency department.  It was a pleasure to meet you.

## 2019-09-06 NOTE — ED Provider Notes (Signed)
[facility] EMERGENCY DEPARTMENT Provider Note   CSN: [id number] Arrival date & time: 09/06/19  1020     History Chief Complaint  Patient presents with  . Allergic Reaction    Micheal Hurley is a 49 y.o. male.  HPI HPI Comments: Micheal Hurley is a 49 y.o. male who presents to the Emergency Department complaining of wasp sting.  Patient states he was outside and was stung by wasp to the right hand.  He began to notice moderate swelling of the right hand and right forearm.  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  He used to carry an EpiPen but states he has not had one for many years due to lack of affordability and health insurance issues.  He states he became anxious due to his medical history and came to the emergency department for evaluation.  He has not taken anything for symptoms.  He denies chest pain, chest tightness, shortness of breath, facial swelling, oral swelling, difficulty swallowing, throat tightness, throat itchiness, nausea, vomiting, abdominal pain.     History reviewed. No pertinent past medical history.  Patient Active Problem List   Diagnosis Date Noted  . Opioid use disorder, severe, dependence (HCC) 11/14/2015  . MDD (major depressive disorder) 11/13/2015    History reviewed. No pertinent surgical history.     Family History  Problem Relation Age of Onset  . Mental illness Neg Hx     Social History   Tobacco Use  . Smoking status: Former Smoker    Packs/day: 0.50    Years: 20.00    Pack years: 10.00    Types: Cigarettes    Quit date: 07/30/2016    Years since quitting: 3.1  . Smokeless tobacco: Never Used  Substance Use Topics  . Alcohol use: No    Comment: history  . Drug use: Yes    Comment: opiates - quit  August 2017    Home Medications Prior to Admission medications   Medication Sig Start Date End Date Taking? Authorizing Provider  cloNIDine (CATAPRES) 0.1 MG tablet Take 1 tablet (0.1 mg  total) by mouth daily before breakfast. 11/18/15   Armandina Stammer I, NP  hydrOXYzine (ATARAX/VISTARIL) 25 MG tablet Take 1 tablet (25 mg total) by mouth every 6 (six) hours as needed for anxiety. 11/17/15   Armandina Stammer I, NP  naproxen (NAPROSYN) 500 MG tablet Take 1 tablet (500 mg total) by mouth 2 (two) times daily. 07/18/17   Khatri, Hina, PA-C  nicotine polacrilex (NICORETTE) 2 MG gum Take 1 each (2 mg total) by mouth as needed for smoking cessation. 11/17/15   Armandina Stammer I, NP  sertraline (ZOLOFT) 25 MG tablet Take 1 tablet (25 mg total) by mouth daily. For depression 11/17/15   Armandina Stammer I, NP  traZODone (DESYREL) 100 MG tablet Take 1 tablet (100 mg total) by mouth at bedtime as needed for sleep. 11/17/15   Sanjuana Kava, NP    Allergies    Bee venom  Review of Systems   Review of Systems  All other systems reviewed and are negative. Ten systems reviewed and are negative for acute change, except as noted in the HPI.    Physical Exam Updated Vital Signs BP (!) 147/82 (BP Location: Right Arm)   Pulse (!) 57   Temp 98.4 F (36.9 C) (Oral)   Resp 15   Ht 6' (1.829 m)   Wt 104.3 kg   SpO2 95%   BMI  31.19 kg/m   Physical Exam Constitutional:      General: He is in acute distress.     Appearance: Normal appearance. He is normal weight. He is not ill-appearing, toxic-appearing or diaphoretic.  HENT:     Head: Normocephalic and atraumatic.     Right Ear: External ear normal.     Left Ear: External ear normal.     Nose: Nose normal.     Mouth/Throat:     Mouth: Mucous membranes are moist.     Pharynx: Oropharynx is clear. No oropharyngeal exudate or posterior oropharyngeal erythema.     Comments: No angioedema appreciated.  No lingual edema appreciated.  Patient is readily handling secretions.  Patient is phonating clearly and coherently.  He is speaking in complete sentences. Eyes:     General: No scleral icterus.       Right eye: No discharge.        Left eye: No discharge.       Extraocular Movements: Extraocular movements intact.     Conjunctiva/sclera: Conjunctivae normal.     Pupils: Pupils are equal, round, and reactive to light.  Cardiovascular:     Rate and Rhythm: Normal rate and regular rhythm.     Pulses: Normal pulses.     Heart sounds: Normal heart sounds. No murmur heard.  No friction rub. No gallop.   Pulmonary:     Effort: Pulmonary effort is normal. No respiratory distress.     Breath sounds: Normal breath sounds. No stridor. No wheezing, rhonchi or rales.     Comments: Symmetrical rise and fall the chest.  Lungs are clear to auscultation bilaterally.  No wheezing, Wells, rhonchi.  No stridor. Abdominal:     General: Abdomen is flat.     Palpations: Abdomen is soft.     Tenderness: There is no abdominal tenderness.  Musculoskeletal:        General: Normal range of motion.     Cervical back: Normal range of motion and neck supple. No rigidity or tenderness.  Skin:    General: Skin is warm and dry.     Capillary Refill: Capillary refill takes less than 2 seconds.  Neurological:     General: No focal deficit present.     Mental Status: He is alert and oriented to person, place, and time.  Psychiatric:        Mood and Affect: Mood normal.        Behavior: Behavior normal.    ED Results / Procedures / Treatments   Labs (all labs ordered are listed, but only abnormal results are displayed) Labs Reviewed - No data to display  EKG None  Radiology No results found.  Procedures Procedures (including critical care time)  Medications Ordered in ED Medications  methylPREDNISolone sodium succinate (SOLU-MEDROL) 125 mg/2 mL injection 125 mg (125 mg Intravenous Given 09/06/19 1050)  diphenhydrAMINE (BENADRYL) injection 50 mg (50 mg Intravenous Given 09/06/19 1050)  famotidine (PEPCID) 40 mg in sodium chloride 0.9 % 50 mL IVPB (0 mg Intravenous Stopped 09/06/19 1229)    ED Course  I have reviewed the triage vital signs and the nursing  notes.  Pertinent labs & imaging results that were available during my care of the patient were reviewed by me and considered in my medical decision making (see chart for details).  Clinical Course as of Sep 05 1344  Fri Sep 06, 2019  1048 Patient is a 49 year old male that presents status post wasp sting.  He has a  history of anaphylaxis to bee stings.  He has not had an EpiPen for years.  He denies any systemic symptoms.  No angioedema.  Exam nonconcerning for anaphylaxis at this time.  Will give patient Solu-Medrol, Benadryl, Pepcid.  We will closely monitor.   [LJ]  1302 Patient states he "feels sleepy" but otherwise is feeling fine.  Edema in the right forearm has moderately alleviated.  There is still a moderate amount of edema along the right hand.  Patient is resting comfortably in bed.  He has no complaints at this time.  No shortness of breath.  Vital signs still stable.  Will reassess.   [LJ]  0347 Patient is feeling better.  Swelling in his right arm has continued to decrease.  He denies any other complaints at this time.  Patient request to go home.  I feel that he is stable for discharge at this time.  His vital signs are still stable.  He was given very strict return precautions.  We discussed the signs and symptoms of anaphylaxis.  Discussed good Rx with patient and his wife.  We will give him a prescription for an EpiPen.  His questions were answered and he was amicable to time of discharge.   [LJ]    Clinical Course User Index [LJ] Rayna Sexton, PA-C   MDM Rules/Calculators/A&P                          Patient is a 49 year old male that presents with history, physical exam, ED clinical course as noted above.  Patient discharged to home/self care.  Condition at discharge: Stable  Note: Portions of this report may have been transcribed using voice recognition software. Every effort was made to ensure accuracy; however, inadvertent computerized transcription errors may be  present.    Final Clinical Impression(s) / ED Diagnoses Final diagnoses:  Allergic reaction, initial encounter  Swelling of right hand    Rx / DC Orders ED Discharge Orders         Ordered    EPINEPHrine 0.3 mg/0.3 mL IJ SOAJ injection  As needed     Discontinue  Reprint     09/06/19 1348           Rayna Sexton, PA-C 09/06/19 1351    Virgel Manifold, MD 09/07/19 1304

## 2019-09-06 NOTE — ED Triage Notes (Signed)
Pt stung by wasp to R hand 15 min PTA. Feels sob, throat tightening, and hives forming. Has similar rxn to yellow jacket sting. No meds PTA

## 2019-12-31 ENCOUNTER — Other Ambulatory Visit: Payer: Self-pay

## 2019-12-31 DIAGNOSIS — Z20822 Contact with and (suspected) exposure to covid-19: Secondary | ICD-10-CM

## 2020-01-01 LAB — NOVEL CORONAVIRUS, NAA: SARS-CoV-2, NAA: NOT DETECTED

## 2020-01-01 LAB — SARS-COV-2, NAA 2 DAY TAT

## 2020-01-02 ENCOUNTER — Other Ambulatory Visit: Payer: Self-pay

## 2020-01-06 ENCOUNTER — Ambulatory Visit: Payer: Self-pay | Attending: Internal Medicine

## 2020-01-06 DIAGNOSIS — Z23 Encounter for immunization: Secondary | ICD-10-CM

## 2020-01-06 NOTE — Progress Notes (Signed)
   Covid-19 Vaccination Clinic  Name:  Micheal Hurley    MRN: [id number] DOB: 1971/03/27  01/06/2020  Micheal Hurley was observed post Covid-19 immunization for 30 minutes based on pre-vaccination screening without incident. He was provided with Vaccine Information Sheet and instruction to access the V-Safe system.   Micheal Hurley was instructed to call 911 with any severe reactions post vaccine: Marland Kitchen Difficulty breathing  . Swelling of face and throat  . A fast heartbeat  . A bad rash all over body  . Dizziness and weakness

## 2020-05-13 ENCOUNTER — Other Ambulatory Visit: Payer: Self-pay

## 2020-05-13 DIAGNOSIS — Z20822 Contact with and (suspected) exposure to covid-19: Secondary | ICD-10-CM

## 2020-05-14 LAB — SARS-COV-2, NAA 2 DAY TAT

## 2020-05-14 LAB — NOVEL CORONAVIRUS, NAA: SARS-CoV-2, NAA: NOT DETECTED

## 2020-09-21 ENCOUNTER — Emergency Department (HOSPITAL_COMMUNITY): Payer: BC Managed Care – PPO

## 2020-09-21 ENCOUNTER — Emergency Department (HOSPITAL_COMMUNITY)
Admission: EM | Admit: 2020-09-21 | Discharge: 2020-09-21 | Disposition: A | Discharge status: Home / Self Care | Payer: BC Managed Care – PPO | Attending: Emergency Medicine | Admitting: Emergency Medicine

## 2020-09-21 ENCOUNTER — Other Ambulatory Visit: Payer: Self-pay

## 2020-09-21 ENCOUNTER — Encounter (HOSPITAL_COMMUNITY): Payer: Self-pay | Admitting: Emergency Medicine

## 2020-09-21 DIAGNOSIS — Z87891 Personal history of nicotine dependence: Secondary | ICD-10-CM | POA: Diagnosis not present

## 2020-09-21 DIAGNOSIS — M25511 Pain in right shoulder: Secondary | ICD-10-CM | POA: Diagnosis present

## 2020-09-21 DIAGNOSIS — Y9371 Activity, boxing: Secondary | ICD-10-CM | POA: Diagnosis not present

## 2020-09-21 DIAGNOSIS — X58XXXA Exposure to other specified factors, initial encounter: Secondary | ICD-10-CM | POA: Diagnosis not present

## 2020-09-21 MED ORDER — LIDOCAINE 5 % EX PTCH: 1.0000 | MEDICATED_PATCH | CUTANEOUS | 0 refills | Status: AC

## 2020-09-21 MED ORDER — LIDOCAINE 5 % EX PTCH
1.0000 | MEDICATED_PATCH | Freq: Once | CUTANEOUS | Status: DC
  Administered 2020-09-21: 1 via TRANSDERMAL
  Filled 2020-09-21: qty 1

## 2020-09-21 MED ORDER — IBUPROFEN 800 MG PO TABS
800.0000 mg | ORAL_TABLET | Freq: Once | ORAL | Status: AC
  Administered 2020-09-21: 800 mg via ORAL
  Filled 2020-09-21: qty 1

## 2020-09-21 NOTE — Discharge Instructions (Addendum)
You were seen in the ER today for your shoulder pain.  Your x-ray was reassuring as medical exam, there is no broken bone or dislocations of your shoulder.  Suspect you have injured one of the ligaments or tendons inside of your shoulder. You were placed in a sling for your comfort.  Below is contact information for Dr. Ophelia Charter with whom you should follow-up for further evaluation and definitive management of your injury. You may alternate Tylenol ibuprofen every 3 hours as needed for your pain and also have been prescribed lidocaine patches to use as needed at home.  You may apply heat or ice whichever feels better.  Return to the ER if develop any new numbness, ting, weakness in your hand, or any other new severe symptoms.

## 2020-09-21 NOTE — ED Triage Notes (Signed)
Pt arrives POV with complaints of a shoulder injury that occurred yesterday afternoon. Pt states he was boxing and was hit the wrong way. Pt states he cannot lift Right arm at all.
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
## 2020-09-25 ENCOUNTER — Ambulatory Visit (INDEPENDENT_AMBULATORY_CARE_PROVIDER_SITE_OTHER): Payer: BC Managed Care – PPO | Admitting: Family Medicine

## 2020-09-25 ENCOUNTER — Ambulatory Visit: Payer: Self-pay

## 2020-09-25 ENCOUNTER — Other Ambulatory Visit: Payer: Self-pay

## 2020-09-25 ENCOUNTER — Encounter: Payer: Self-pay | Admitting: Family Medicine

## 2020-09-25 DIAGNOSIS — M25511 Pain in right shoulder: Secondary | ICD-10-CM

## 2020-09-25 MED ORDER — BACLOFEN 10 MG PO TABS: 5.0000 mg | ORAL_TABLET | Freq: Three times a day (TID) | ORAL | 3 refills | Status: AC | PRN
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
## 2020-10-06 ENCOUNTER — Telehealth: Payer: Self-pay

## 2020-10-06 NOTE — Telephone Encounter (Signed)
Somebody that works there sorry I didn't make that clear

## 2020-10-06 NOTE — Telephone Encounter (Signed)
Donald Siva called requesting the Mri results to be faxed to [facility].  Fax # is [phone]

## 2020-10-07 ENCOUNTER — Telehealth: Payer: Self-pay

## 2020-10-07 ENCOUNTER — Telehealth: Payer: Self-pay | Admitting: Family Medicine

## 2020-10-07 NOTE — Telephone Encounter (Signed)
This patient is scheduled at [facility] for tomorrow. Not sure what this is about, so sending to you for help.

## 2020-10-07 NOTE — Telephone Encounter (Signed)
Rocio from [employer] called she is requesting a order for a mri of right shoulder with contrast to be sent to [facility] [phone] call [phone] option 2 then option 3

## 2020-10-07 NOTE — Telephone Encounter (Signed)
Pt wants to go to [facility]

## 2020-10-07 NOTE — Telephone Encounter (Signed)
Patient walked in and requested to have his MRI sent to another location [facility] fax:[phone]).

## 2020-10-07 NOTE — Telephone Encounter (Signed)
Duplicate note - Detailed message forwarded to the referral coordinator.

## 2020-10-07 NOTE — Telephone Encounter (Signed)
Order faxed to [facility]

## 2020-10-08 ENCOUNTER — Telehealth: Payer: Self-pay | Admitting: Family Medicine

## 2020-10-08 ENCOUNTER — Other Ambulatory Visit: Payer: Self-pay

## 2020-10-08 NOTE — Telephone Encounter (Signed)
Re-faxed to number given.

## 2020-10-08 NOTE — Telephone Encounter (Signed)
Please double-check the fax number where you sent this yesterday.

## 2020-10-08 NOTE — Telephone Encounter (Signed)
Patient walked in and requested to have his MRI sent to [facility] fax:[phone]. Says they did not receive it.

## 2020-10-12 ENCOUNTER — Inpatient Hospital Stay: Admission: RE | Admit: 2020-10-12 | Payer: Self-pay | Source: Ambulatory Visit

## 2020-10-12 ENCOUNTER — Other Ambulatory Visit: Payer: Self-pay

## 2020-10-15 ENCOUNTER — Other Ambulatory Visit: Payer: Self-pay

## 2020-10-15 ENCOUNTER — Ambulatory Visit (INDEPENDENT_AMBULATORY_CARE_PROVIDER_SITE_OTHER): Payer: BC Managed Care – PPO

## 2020-10-15 ENCOUNTER — Ambulatory Visit: Payer: BC Managed Care – PPO | Admitting: Podiatry

## 2020-10-15 DIAGNOSIS — M216X1 Other acquired deformities of right foot: Secondary | ICD-10-CM

## 2020-10-15 DIAGNOSIS — M722 Plantar fascial fibromatosis: Secondary | ICD-10-CM

## 2020-10-15 DIAGNOSIS — S86899A Other injury of other muscle(s) and tendon(s) at lower leg level, unspecified leg, initial encounter: Secondary | ICD-10-CM

## 2020-10-15 DIAGNOSIS — M79671 Pain in right foot: Secondary | ICD-10-CM

## 2020-10-15 DIAGNOSIS — M79673 Pain in unspecified foot: Secondary | ICD-10-CM

## 2020-10-15 DIAGNOSIS — M216X2 Other acquired deformities of left foot: Secondary | ICD-10-CM | POA: Diagnosis not present

## 2020-10-15 DIAGNOSIS — M79672 Pain in left foot: Secondary | ICD-10-CM

## 2020-10-15 MED ORDER — MELOXICAM 15 MG PO TABS: 15.0000 mg | ORAL_TABLET | Freq: Every day | ORAL | 0 refills | Status: DC

## 2020-10-15 NOTE — Patient Instructions (Signed)

## 2020-10-15 NOTE — Progress Notes (Signed)
Subjective:   Patient ID: Micheal Hurley, male   DOB: 50 y.o.   MRN: [id number]   HPI 50 year old male presents the office with concerns of bilateral foot pain mostly in the heel, arch.  Also states he began shinsplints.  Is been ongoing for quite some time.  He states that he exercises a lot wears boots.  He tried some Dr. Margart Sickles inserts.  Right side worse than left.  No recent injury.  No numbness or tingling or radiating pain otherwise.   Review of Systems  All other systems reviewed and are negative.  No past medical history on file.  No past surgical history on file.   Current Outpatient Medications:    meloxicam (MOBIC) 15 MG tablet, Take 1 tablet (15 mg total) by mouth daily., Disp: 30 tablet, Rfl: 0   baclofen (LIORESAL) 10 MG tablet, Take 0.5-1 tablets (5-10 mg total) by mouth 3 (three) times daily as needed for muscle spasms., Disp: 30 each, Rfl: 3   EPINEPHrine 0.3 mg/0.3 mL IJ SOAJ injection, Inject 0.3 mLs (0.3 mg total) into the muscle as needed for anaphylaxis., Disp: 1 each, Rfl: 0   esomeprazole (NEXIUM) 40 MG capsule, Take by mouth., Disp: , Rfl:    lidocaine (LIDODERM) 5 %, Place 1 patch onto the skin daily. Remove & Discard patch within 12 hours or as directed by MD, Disp: 30 patch, Rfl: 0  Allergies  Allergen Reactions   Bee Venom Anaphylaxis          Objective:  Physical Exam  General: AAO x3, NAD  Dermatological: Skin is warm, dry and supple bilateral. . There are no open sores, no preulcerative lesions, no rash or signs of infection present.  Vascular: Dorsalis Pedis artery and Posterior Tibial artery pedal pulses are 2/4 bilateral with immedate capillary fill time. Pedal hair growth present. There is no pain with calf compression, swelling, warmth, erythema.   Neruologic: Grossly intact via light touch bilateral.  Negative Tinel sign.  Musculoskeletal: There is tenderness on her medial tubercle of the calcaneus and insertion of plantar fascia also  in the arch of the foot.  There is no pain with lateral compression of calcaneus.  There is no pain Achilles tendon.  Subjectively gets discomfort the anterior aspect the legs, shinsplints but no significant discomfort today.  Equinus present.  Muscular strength 5/5 in all groups tested bilateral.  Gait: Unassisted, Nonantalgic.       Assessment:   Plantar fasciitis, shinsplints     Plan:  -Treatment options discussed including all alternatives, risks, and complications -Etiology of symptoms were discussed -X-rays were obtained and reviewed with the patient.  No evidence of acute fracture or stress fracture. -Prescribed mobic. Discussed side effects of the medication and directed to stop if any are to occur and call the office.  -Stretching, icing daily discussed.  Discussed shoe modifications and arch supports.  He is measured for orthotics today.  Vivi Barrack DPM

## 2020-10-23 ENCOUNTER — Other Ambulatory Visit: Payer: Self-pay | Admitting: Podiatry

## 2020-10-23 DIAGNOSIS — M722 Plantar fascial fibromatosis: Secondary | ICD-10-CM

## 2020-10-28 ENCOUNTER — Encounter: Payer: Self-pay | Admitting: Family Medicine

## 2020-10-30 ENCOUNTER — Telehealth: Payer: Self-pay | Admitting: Family Medicine

## 2020-10-30 NOTE — Telephone Encounter (Signed)
Right shoulder MRI arthrogram is notable for a full-thickness tear of the supraspinatus tendon measuring 1.5 cm in length.  There is 0.6 cm of retraction.  Labrum cartilage looks good.

## 2020-11-02 NOTE — Telephone Encounter (Signed)
Appt made for patient to see Dr. Roda Shutters tomorrow.  Thank you.

## 2020-11-03 ENCOUNTER — Encounter: Payer: Self-pay | Admitting: Orthopaedic Surgery

## 2020-11-03 ENCOUNTER — Ambulatory Visit (INDEPENDENT_AMBULATORY_CARE_PROVIDER_SITE_OTHER): Payer: BC Managed Care – PPO | Admitting: Orthopaedic Surgery

## 2020-11-03 ENCOUNTER — Other Ambulatory Visit: Payer: Self-pay

## 2020-11-03 DIAGNOSIS — S46811A Strain of other muscles, fascia and tendons at shoulder and upper arm level, right arm, initial encounter: Secondary | ICD-10-CM | POA: Diagnosis not present
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
## 2020-11-05 ENCOUNTER — Other Ambulatory Visit: Payer: Self-pay

## 2020-11-05 ENCOUNTER — Ambulatory Visit (INDEPENDENT_AMBULATORY_CARE_PROVIDER_SITE_OTHER): Payer: BC Managed Care – PPO

## 2020-11-05 DIAGNOSIS — M722 Plantar fascial fibromatosis: Secondary | ICD-10-CM

## 2020-11-05 NOTE — Progress Notes (Signed)
Patient in office today to pick-up custom orthotic order. Orthotics were tried on by patient in his running shoes. Patient satisfied with the fit and feel of the orthotics. Patient was educated on the break-in process and patient verbalized understanding. Advised patient to call the office with any questions, comments, or concerns.

## 2020-11-10 ENCOUNTER — Encounter (HOSPITAL_BASED_OUTPATIENT_CLINIC_OR_DEPARTMENT_OTHER): Payer: Self-pay | Admitting: Orthopaedic Surgery

## 2020-11-10 ENCOUNTER — Other Ambulatory Visit: Payer: Self-pay

## 2020-11-10 ENCOUNTER — Other Ambulatory Visit: Payer: Self-pay | Admitting: Podiatry

## 2020-11-10 NOTE — Telephone Encounter (Signed)
Please advise 

## 2020-11-12 ENCOUNTER — Other Ambulatory Visit: Payer: Self-pay

## 2020-11-18 ENCOUNTER — Encounter (HOSPITAL_BASED_OUTPATIENT_CLINIC_OR_DEPARTMENT_OTHER): Payer: Self-pay | Admitting: Orthopaedic Surgery

## 2020-11-18 ENCOUNTER — Ambulatory Visit (HOSPITAL_BASED_OUTPATIENT_CLINIC_OR_DEPARTMENT_OTHER): Payer: BC Managed Care – PPO | Admitting: Certified Registered"

## 2020-11-18 ENCOUNTER — Encounter (HOSPITAL_BASED_OUTPATIENT_CLINIC_OR_DEPARTMENT_OTHER)
Admission: RE | Disposition: A | Discharge status: Home / Self Care | Payer: Self-pay | Source: Home / Self Care | Attending: Orthopaedic Surgery

## 2020-11-18 ENCOUNTER — Other Ambulatory Visit: Payer: Self-pay

## 2020-11-18 ENCOUNTER — Encounter: Payer: Self-pay | Admitting: Orthopaedic Surgery

## 2020-11-18 ENCOUNTER — Ambulatory Visit (HOSPITAL_BASED_OUTPATIENT_CLINIC_OR_DEPARTMENT_OTHER)
Admission: RE | Admit: 2020-11-18 | Discharge: 2020-11-18 | Disposition: A | Discharge status: Home / Self Care | Payer: BC Managed Care – PPO | Attending: Orthopaedic Surgery | Admitting: Orthopaedic Surgery

## 2020-11-18 DIAGNOSIS — M7541 Impingement syndrome of right shoulder: Secondary | ICD-10-CM | POA: Diagnosis not present

## 2020-11-18 DIAGNOSIS — Z87891 Personal history of nicotine dependence: Secondary | ICD-10-CM | POA: Diagnosis not present

## 2020-11-18 DIAGNOSIS — Z791 Long term (current) use of non-steroidal anti-inflammatories (NSAID): Secondary | ICD-10-CM | POA: Insufficient documentation

## 2020-11-18 DIAGNOSIS — Z79899 Other long term (current) drug therapy: Secondary | ICD-10-CM | POA: Insufficient documentation

## 2020-11-18 DIAGNOSIS — M75121 Complete rotator cuff tear or rupture of right shoulder, not specified as traumatic: Secondary | ICD-10-CM | POA: Insufficient documentation

## 2020-11-18 DIAGNOSIS — S46811A Strain of other muscles, fascia and tendons at shoulder and upper arm level, right arm, initial encounter: Secondary | ICD-10-CM

## 2020-11-18 HISTORY — DX: Anxiety disorder, unspecified: F41.9

## 2020-11-18 HISTORY — DX: Essential (primary) hypertension: I10

## 2020-11-18 HISTORY — PX: SHOULDER ARTHROSCOPY WITH ROTATOR CUFF REPAIR AND SUBACROMIAL DECOMPRESSION: SHX5686

## 2020-11-18 HISTORY — DX: Gastro-esophageal reflux disease without esophagitis: K21.9

## 2020-11-18 HISTORY — DX: Other psychoactive substance abuse, uncomplicated: F19.10

## 2020-11-18 HISTORY — DX: Depression, unspecified: F32.A

## 2020-11-18 SURGERY — SHOULDER ARTHROSCOPY WITH ROTATOR CUFF REPAIR AND SUBACROMIAL DECOMPRESSION
Anesthesia: General | Site: Shoulder | Laterality: Right

## 2020-11-18 MED ORDER — LACTATED RINGERS IV SOLN: INTRAVENOUS | Status: DC

## 2020-11-18 MED ORDER — ONDANSETRON HCL 4 MG/2ML IJ SOLN
INTRAMUSCULAR | Status: DC | PRN
  Administered 2020-11-18: 4 mg via INTRAVENOUS

## 2020-11-18 MED ORDER — MIDAZOLAM HCL 2 MG/2ML IJ SOLN
INTRAMUSCULAR | Status: AC
  Filled 2020-11-18: qty 2

## 2020-11-18 MED ORDER — DEXMEDETOMIDINE (PRECEDEX) IN NS 20 MCG/5ML (4 MCG/ML) IV SYRINGE
PREFILLED_SYRINGE | INTRAVENOUS | Status: DC | PRN
  Administered 2020-11-18: 8 ug via INTRAVENOUS

## 2020-11-18 MED ORDER — FENTANYL CITRATE (PF) 100 MCG/2ML IJ SOLN
INTRAMUSCULAR | Status: AC
  Filled 2020-11-18: qty 2

## 2020-11-18 MED ORDER — SODIUM CHLORIDE 0.9 % IR SOLN
Status: DC | PRN
  Administered 2020-11-18: 10000 mL

## 2020-11-18 MED ORDER — BUPIVACAINE LIPOSOME 1.3 % IJ SUSP
INTRAMUSCULAR | Status: DC | PRN
  Administered 2020-11-18: 10 mL via PERINEURAL

## 2020-11-18 MED ORDER — CEFAZOLIN SODIUM-DEXTROSE 2-4 GM/100ML-% IV SOLN
2.0000 g | INTRAVENOUS | Status: AC
  Administered 2020-11-18: 2 g via INTRAVENOUS

## 2020-11-18 MED ORDER — LIDOCAINE HCL (CARDIAC) PF 100 MG/5ML IV SOSY
PREFILLED_SYRINGE | INTRAVENOUS | Status: DC | PRN
  Administered 2020-11-18: 60 mg via INTRAVENOUS

## 2020-11-18 MED ORDER — AMISULPRIDE (ANTIEMETIC) 5 MG/2ML IV SOLN: 10.0000 mg | Freq: Once | INTRAVENOUS | Status: DC | PRN

## 2020-11-18 MED ORDER — MIDAZOLAM HCL 2 MG/2ML IJ SOLN
2.0000 mg | Freq: Once | INTRAMUSCULAR | Status: AC
  Administered 2020-11-18: 2 mg via INTRAVENOUS

## 2020-11-18 MED ORDER — OXYCODONE HCL 5 MG PO TABS
5.0000 mg | ORAL_TABLET | Freq: Once | ORAL | Status: DC | PRN
Start: 2020-11-18 — End: 2020-11-18

## 2020-11-18 MED ORDER — EPINEPHRINE PF 1 MG/ML IJ SOLN
INTRAMUSCULAR | Status: AC
  Filled 2020-11-18: qty 3

## 2020-11-18 MED ORDER — BUPIVACAINE HCL (PF) 0.5 % IJ SOLN
INTRAMUSCULAR | Status: DC | PRN
  Administered 2020-11-18: 20 mL via PERINEURAL

## 2020-11-18 MED ORDER — BUPIVACAINE HCL (PF) 0.25 % IJ SOLN
INTRAMUSCULAR | Status: AC
  Filled 2020-11-18: qty 30

## 2020-11-18 MED ORDER — HYDROMORPHONE HCL 1 MG/ML IJ SOLN: 0.2500 mg | INTRAMUSCULAR | Status: DC | PRN

## 2020-11-18 MED ORDER — OXYCODONE-ACETAMINOPHEN 5-325 MG PO TABS: 1.0000 | ORAL_TABLET | Freq: Three times a day (TID) | ORAL | 0 refills | Status: DC | PRN

## 2020-11-18 MED ORDER — BUPIVACAINE-EPINEPHRINE (PF) 0.25% -1:200000 IJ SOLN
INTRAMUSCULAR | Status: AC
  Filled 2020-11-18: qty 30

## 2020-11-18 MED ORDER — PROPOFOL 10 MG/ML IV BOLUS
INTRAVENOUS | Status: DC | PRN
  Administered 2020-11-18: 200 mg via INTRAVENOUS

## 2020-11-18 MED ORDER — OXYCODONE HCL 5 MG/5ML PO SOLN
5.0000 mg | Freq: Once | ORAL | Status: DC | PRN
Start: 2020-11-18 — End: 2020-11-18

## 2020-11-18 MED ORDER — CEFAZOLIN SODIUM-DEXTROSE 2-4 GM/100ML-% IV SOLN
INTRAVENOUS | Status: AC
  Filled 2020-11-18: qty 100

## 2020-11-18 MED ORDER — BUPIVACAINE-EPINEPHRINE 0.25% -1:200000 IJ SOLN
INTRAMUSCULAR | Status: DC | PRN
  Administered 2020-11-18: 30 mL

## 2020-11-18 MED ORDER — BUPIVACAINE HCL (PF) 0.5 % IJ SOLN
INTRAMUSCULAR | Status: AC
  Filled 2020-11-18: qty 30

## 2020-11-18 MED ORDER — PROMETHAZINE HCL 25 MG/ML IJ SOLN: 6.2500 mg | INTRAMUSCULAR | Status: DC | PRN

## 2020-11-18 MED ORDER — MEPERIDINE HCL 25 MG/ML IJ SOLN: 6.2500 mg | INTRAMUSCULAR | Status: DC | PRN

## 2020-11-18 MED ORDER — DEXAMETHASONE SODIUM PHOSPHATE 4 MG/ML IJ SOLN
INTRAMUSCULAR | Status: DC | PRN
  Administered 2020-11-18: 4 mg via INTRAVENOUS

## 2020-11-18 MED ORDER — FENTANYL CITRATE (PF) 100 MCG/2ML IJ SOLN
100.0000 ug | Freq: Once | INTRAMUSCULAR | Status: AC
Start: 2020-11-18 — End: 2020-11-18
  Administered 2020-11-18: 100 ug via INTRAVENOUS

## 2020-11-18 SURGICAL SUPPLY — 69 items
ANCH SUT KNTLS SLF PNCH MTL (Anchor) ×1 IMPLANT
APL SKNCLS STERI-STRIP NONHPOA (GAUZE/BANDAGES/DRESSINGS)
BENZOIN TINCTURE PRP APPL 2/3 (GAUZE/BANDAGES/DRESSINGS) IMPLANT
BLADE EXCALIBUR 4.0X13 (MISCELLANEOUS) IMPLANT
BLADE SURG 15 STRL LF DISP TIS (BLADE) IMPLANT
BLADE SURG 15 STRL SS (BLADE)
BURR OVAL 8 FLU 4.0X13 (MISCELLANEOUS) ×2 IMPLANT
CANNULA 5.75X71 LONG (CANNULA) ×2 IMPLANT
CANNULA SHOULDER 7CM (CANNULA) ×2 IMPLANT
CANNULA TWIST IN 8.25X7CM (CANNULA) ×2 IMPLANT
CLSR STERI-STRIP ANTIMIC 1/2X4 (GAUZE/BANDAGES/DRESSINGS) IMPLANT
COOLER ICEMAN CLASSIC (MISCELLANEOUS) ×2 IMPLANT
DECANTER SPIKE VIAL GLASS SM (MISCELLANEOUS) IMPLANT
DISSECTOR  3.8MM X 13CM (MISCELLANEOUS) ×1
DISSECTOR 3.8MM X 13CM (MISCELLANEOUS) ×1 IMPLANT
DRAPE IMP U-DRAPE 54X76 (DRAPES) ×2 IMPLANT
DRAPE INCISE IOBAN 66X45 STRL (DRAPES) ×2 IMPLANT
DRAPE STERI 35X30 U-POUCH (DRAPES) ×2 IMPLANT
DRAPE SURG 17X23 STRL (DRAPES) ×2 IMPLANT
DRAPE U-SHAPE 47X51 STRL (DRAPES) ×2 IMPLANT
DRAPE U-SHAPE 76X120 STRL (DRAPES) ×4 IMPLANT
DRSG PAD ABDOMINAL 8X10 ST (GAUZE/BANDAGES/DRESSINGS) ×4 IMPLANT
DURAPREP 26ML APPLICATOR (WOUND CARE) ×4 IMPLANT
ELECT REM PT RETURN 9FT ADLT (ELECTROSURGICAL)
ELECTRODE REM PT RTRN 9FT ADLT (ELECTROSURGICAL) IMPLANT
GAUZE 4X4 16PLY ~~LOC~~+RFID DBL (SPONGE) ×2 IMPLANT
GAUZE SPONGE 4X4 12PLY STRL (GAUZE/BANDAGES/DRESSINGS) ×4 IMPLANT
GAUZE XEROFORM 1X8 LF (GAUZE/BANDAGES/DRESSINGS) ×2 IMPLANT
GLOVE SURG NEOP MICRO LF SZ7.5 (GLOVE) ×2 IMPLANT
GLOVE SURG POLYISO LF SZ6.5 (GLOVE) ×2 IMPLANT
GLOVE SURG POLYISO LF SZ7 (GLOVE) ×4 IMPLANT
GLOVE SURG SYN 7.5  E (GLOVE) ×2
GLOVE SURG SYN 7.5 E (GLOVE) ×2 IMPLANT
GLOVE SURG UNDER POLY LF SZ7 (GLOVE) ×8 IMPLANT
GLOVE SURG UNDER POLY LF SZ7.5 (GLOVE) ×4 IMPLANT
GOWN STRL REIN XL XLG (GOWN DISPOSABLE) ×4 IMPLANT
GOWN STRL REUS W/ TWL LRG LVL3 (GOWN DISPOSABLE) ×1 IMPLANT
GOWN STRL REUS W/ TWL XL LVL3 (GOWN DISPOSABLE) ×1 IMPLANT
GOWN STRL REUS W/TWL LRG LVL3 (GOWN DISPOSABLE) ×2
GOWN STRL REUS W/TWL XL LVL3 (GOWN DISPOSABLE) ×2
IMPL ANCHOR PEEK 4.5MM (Anchor) ×1 IMPLANT
IMPLANT ANCHOR PEEK 4.5MM (Anchor) ×2 IMPLANT
MANIFOLD NEPTUNE II (INSTRUMENTS) ×2 IMPLANT
NEEDLE HYPO 22GX1.5 SAFETY (NEEDLE) ×2 IMPLANT
NEEDLE SCORPION MULTI FIRE (NEEDLE) IMPLANT
NEEDLE SPNL 18GX3.5 QUINCKE PK (NEEDLE) ×2 IMPLANT
NEEDLE SUT PASSER RTC (NEEDLE) ×2 IMPLANT
PACK ARTHROSCOPY DSU (CUSTOM PROCEDURE TRAY) ×2 IMPLANT
PACK BASIN DAY SURGERY FS (CUSTOM PROCEDURE TRAY) ×2 IMPLANT
PAD COLD SHLDR WRAP-ON (PAD) ×2 IMPLANT
PAD ORTHO SHOULDER 7X19 LRG (SOFTGOODS) ×2 IMPLANT
PORT APPOLLO RF 90DEGREE MULTI (SURGICAL WAND) ×2 IMPLANT
SHEET MEDIUM DRAPE 40X70 STRL (DRAPES) ×4 IMPLANT
SLEEVE SCD COMPRESS KNEE MED (STOCKING) ×2 IMPLANT
SLING ARM FOAM STRAP LRG (SOFTGOODS) IMPLANT
SUT BROADBAND TAPE 2PK 1.5 (SUTURE) ×2 IMPLANT
SUT ETHILON 3 0 PS 1 (SUTURE) ×4 IMPLANT
SUT FIBERWIRE #2 38 T-5 BLUE (SUTURE)
SUT TIGER TAPE 7 IN WHITE (SUTURE) IMPLANT
SUTURE FIBERWR #2 38 T-5 BLUE (SUTURE) IMPLANT
SUTURE TAPE 1.3 40 TPR END (SUTURE) IMPLANT
SUTURE TAPE TIGERLINK 1.3MM BL (SUTURE) IMPLANT
SUTURETAPE 1.3 40 TPR END (SUTURE)
SUTURETAPE TIGERLINK 1.3MM BL (SUTURE)
SYR 50ML LL SCALE MARK (SYRINGE) ×2 IMPLANT
TAPE FIBER 2MM 7IN #2 BLUE (SUTURE) IMPLANT
TOWEL GREEN STERILE FF (TOWEL DISPOSABLE) ×2 IMPLANT
TUBE CONNECTING 20X1/4 (TUBING) ×2 IMPLANT
TUBING ARTHROSCOPY IRRIG 16FT (MISCELLANEOUS) ×2 IMPLANT

## 2020-11-18 NOTE — Discharge Instructions (Addendum)
Post-operative patient instructions  Shoulder Arthroscopy   Ice:  Place intermittent ice or cooler pack over your shoulder, 30 minutes on and 30 minutes off.  Continue this for the first 72 hours after surgery, then save ice for use after therapy sessions or on more active days.   Weight:  You may NOT bear weight on your arm as your symptoms allow. Dressing:  Perform 1st dressing change at 2 days postoperative. A moderate amount of blood tinged drainage is to be expected.  So if you bleed through the dressing on the first or second day or if you have fevers, it is fine to change the dressing/check the wounds early and redress wound.  If it bleeds through again, or if the incisions are leaking frank blood, please call the office. May change dressing every 1-2 days thereafter to help watch wounds. Can purchase Tegaderm (or 54M Nexcare) water resistant dressings at local pharmacy / [facility]. Shower:  Light shower is ok after 2 days.  Please take shower, NO bath. Recover with gauze and ace wrap to help keep wounds protected.   Pain medication:  A narcotic pain medication has been prescribed.  Take as directed.  Typically you need narcotic pain medication more regularly during the first 3 to 5 days after surgery.  Decrease your use of the medication as the pain improves.  Narcotics can sometimes cause constipation, even after a few doses.  If you have problems with constipation, you can take an over the counter stool softener or light laxative.  If you have persistent problems, please notify your physician's office. Physical therapy: Additional activity guidelines to be provided by your physician or physical therapist at follow-up visits.  Driving: Do not recommend driving x 2 weeks post surgical, especially if surgery performed on right side. Should not drive while taking narcotic pain medications. It typically takes at least 2 weeks to restore sufficient neuromuscular function for normal reaction times for  driving safety.  Call [phone] for questions or problems. Evenings you will be forwarded to the hospital operator.  Ask for the orthopaedic physician on call. Please call if you experience:    Redness, foul smelling, or persistent drainage from the surgical site  worsening shoulder pain and swelling not responsive to medication  any calf pain and or swelling of the lower leg  temperatures greater than 101.5 F other questions or concerns   Thank you for allowing [place] to be a part of your care.   Post Anesthesia Home Care Instructions  Activity: Get plenty of rest for the remainder of the day. A responsible individual must stay with you for 24 hours following the procedure.  For the next 24 hours, DO NOT: -Drive a car -Paediatric nurse -Drink alcoholic beverages -Take any medication unless instructed by your physician -Make any legal decisions or sign important papers.  Meals: Start with liquid foods such as gelatin or soup. Progress to regular foods as tolerated. Avoid greasy, spicy, heavy foods. If nausea and/or vomiting occur, drink only clear liquids until the nausea and/or vomiting subsides. Call your physician if vomiting continues.  Special Instructions/Symptoms: Your throat may feel dry or sore from the anesthesia or the breathing tube placed in your throat during surgery. If this causes discomfort, gargle with warm salt water. The discomfort should disappear within 24 hours.  If you had a scopolamine patch placed behind your ear for the management of post- operative nausea and/or vomiting:  1. The medication in the patch is effective  for 72 hours, after which it should be removed.  Wrap patch in a tissue and discard in the trash. Wash hands thoroughly with soap and water. 2. You may remove the patch earlier than 72 hours if you experience unpleasant side effects which may include dry mouth, dizziness or visual disturbances. 3. Avoid touching the patch. Wash your hands with  soap and water after contact with the patch.    Regional Anesthesia Blocks  1. Numbness or the inability to move the "blocked" extremity may last from 3-48 hours after placement. The length of time depends on the medication injected and your individual response to the medication. If the numbness is not going away after 48 hours, call your surgeon.  2. The extremity that is blocked will need to be protected until the numbness is gone and the  Strength has returned. Because you cannot feel it, you will need to take extra care to avoid injury. Because it may be weak, you may have difficulty moving it or using it. You may not know what position it is in without looking at it while the block is in effect.  3. For blocks in the legs and feet, returning to weight bearing and walking needs to be done carefully. You will need to wait until the numbness is entirely gone and the strength has returned. You should be able to move your leg and foot normally before you try and bear weight or walk. You will need someone to be with you when you first try to ensure you do not fall and possibly risk injury.  4. Bruising and tenderness at the needle site are common side effects and will resolve in a few days.  5. Persistent numbness or new problems with movement should be communicated to the surgeon or the [facility] ([phone])/  Surgery Center ([phone]). Information for Discharge Teaching: EXPAREL (bupivacaine liposome injectable suspension)   Your surgeon or anesthesiologist gave you EXPAREL(bupivacaine) to help control your pain after surgery.  EXPAREL is a local anesthetic that provides pain relief by numbing the tissue around the surgical site. EXPAREL is designed to release pain medication over time and can control pain for up to 72 hours. Depending on how you respond to EXPAREL, you may require less pain medication during your recovery.  Possible side effects: Temporary loss of  sensation or ability to move in the area where bupivacaine was injected. Nausea, vomiting, constipation Rarely, numbness and tingling in your mouth or lips, lightheadedness, or anxiety may occur. Call your doctor right away if you think you may be experiencing any of these sensations, or if you have other questions regarding possible side effects.  Follow all other discharge instructions given to you by your surgeon or nurse. Eat a healthy diet and drink plenty of water or other fluids.  If you return to the hospital for any reason within 96 hours following the administration of EXPAREL, it is important for health care providers to know that you have received this anesthetic. A teal colored band has been placed on your arm with the date, time and amount of EXPAREL you have received in order to alert and inform your health care providers. Please leave this armband in place for the full 96 hours following administration, and then you may remove the band.       

## 2020-11-18 NOTE — Anesthesia Postprocedure Evaluation (Signed)
Anesthesia Post Note  Patient: Micheal Hurley  Procedure(s) Performed: RIGHT SHOULDER ARTHROSCOPY WITH ROTATOR CUFF REPAIR AND SUBACROMIAL DECOMPRESSION, AND EXTENSIVE DEBRIDEMENT (Right: Shoulder)     Patient location during evaluation: PACU Anesthesia Type: General Level of consciousness: awake and alert Pain management: pain level controlled Vital Signs Assessment: post-procedure vital signs reviewed and stable Respiratory status: spontaneous breathing, nonlabored ventilation and respiratory function stable Cardiovascular status: blood pressure returned to baseline and stable Postop Assessment: no apparent nausea or vomiting Anesthetic complications: no   No notable events documented.  Last Vitals:  Vitals:   11/18/20 1415 11/18/20 1431  BP: 112/73 121/80  Pulse: 71 72  Resp: 19 17  Temp:  (!) 36.4 C  SpO2: 90% 96%    Last Pain:  Vitals:   11/18/20 1431  TempSrc:   PainSc: 0-No pain                 Lowella Curb

## 2020-11-18 NOTE — Op Note (Signed)
   Date of Surgery: 11/18/2020  INDICATIONS: The patient is a 50 year old male with right shoulder pain that has failed conservative treatment;  The patient did consent to the procedure after discussion of the risks and benefits.  PREOPERATIVE DIAGNOSIS:  1.  Full-thickness tear of right supraspinatus tendon 2.  Right shoulder impingement  POSTOPERATIVE DIAGNOSIS: Same.  PROCEDURE:  1.  Right shoulder arthroscopic repair of supraspinatus tendon 2.  Right shoulder arthroscopic extensive debridement of supraspinatus, infraspinatus, labrum, biceps tendon, bursa 3.  Right shoulder arthroscopic subacromial decompression with acromioplasty  SURGEON: N. Glee Arvin, M.D.  ASSIST: Oneal Grout, PA-C  ANESTHESIA:  general, regional  IV FLUIDS AND URINE: See anesthesia.  ESTIMATED BLOOD LOSS: minimal mL.  IMPLANTS: [place] 4.5 mm Quatro anchor  COMPLICATIONS: None.  DESCRIPTION OF PROCEDURE: The patient was brought to the operating room and placed supine on the operating table.  The patient had been signed prior to the procedure and this was documented. The patient had the anesthesia placed by the anesthesiologist.  A time-out was performed to confirm that this was the correct patient, site, side and location. The patient did receive antibiotics prior to the incision and was re-dosed during the procedure as needed at indicated intervals.  The patient was then positioned into the beach chair position with all bony prominences well padded and neutral C spine. The patient had the operative extremity prepped and draped in the standard surgical fashion.    Incisions were made for standard shoulder arthroscopy portals.  Diagnostic shoulder arthroscopy was first performed.  This revealed a full-thickness tear of the anterior supraspinatus with mild retraction.  This was a crescent shaped tear.  He had mild degenerative labral fraying mainly limited to the superior labrum.  There is mild biceps  tenosynovitis.  He had minimal chondromalacia of the glenohumeral surface.  All the surfaces were gently debrided using oscillating shaver back to stable margins.  Subscapularis tendon was unremarkable.  I then reposition the arthroscope into the subacromial space.  The bursa was debrided away.  Subacromial space was fairly tight.  Subacromial decompression with acromioplasty was performed.  The [facility] joint was unremarkable therefore I left this alone.  After the subacromial decompression I then created an extra posterior lateral portal for the arthroscope to gain better visualization of the tear.  With the arm externally rotated we found the full-thickness crescent-shaped tear.  The tissue was debrided back to stable margins.  The bone was prepared with Bovie and high-speed bur.  I then used 2 fiber tapes creating 4 separate strands in order to perform a single row repair using a 4.5 mm anchor.  There was excellent fixation and reopposition of the supraspinatus back to its footprint.  Excess fluid was removed from the shoulder.  Incisions closed with interrupted nylon sutures.  Sterile dressings were applied.  Shoulder immobilizer with abduction pillow was placed.  Patient tolerated procedure well had no immediate complications. Tessa Lerner, my PA, was a medical necessity for the entirety of the surgery including opening, closing, limb positioning, retracting, exposing, and repairing.  POSTOPERATIVE PLAN: Patient will be discharged home and follow-up in a week for suture removal and initiation of physical therapy.  Mayra Reel, MD [facility] [phone] 1:34 PM

## 2020-11-18 NOTE — Anesthesia Procedure Notes (Signed)
Anesthesia Regional Block: Interscalene brachial plexus block   Pre-Anesthetic Checklist: , timeout performed,  Correct Patient, Correct Site, Correct Laterality,  Correct Procedure, Correct Position, site marked,  Risks and benefits discussed,  Surgical consent,  Pre-op evaluation,  At surgeon's request and post-op pain management  Laterality: Right  Prep: chloraprep       Needles:  Injection technique: Single-shot  Needle Type: Stimiplex     Needle Length: 9cm  Needle Gauge: 21     Additional Needles:   Procedures:,,,, ultrasound used (permanent image in chart),,    Narrative:  Start time: 11/18/2020 11:10 AM End time: 11/18/2020 11:15 AM Injection made incrementally with aspirations every 5 mL.  Performed by: Personally  Anesthesiologist: Lowella Curb, MD

## 2020-11-18 NOTE — Anesthesia Preprocedure Evaluation (Signed)
Anesthesia Evaluation  Patient identified by MRN, date of birth, ID band Patient awake    Reviewed: Allergy & Precautions, NPO status , Patient's Chart, lab work & pertinent test results  Airway Mallampati: II  TM Distance: >3 FB Neck ROM: Full    Dental no notable dental hx.    Pulmonary neg pulmonary ROS, former smoker,    Pulmonary exam normal breath sounds clear to auscultation       Cardiovascular hypertension, Pt. on medications negative cardio ROS Normal cardiovascular exam Rhythm:Regular Rate:Normal     Neuro/Psych Anxiety Depression negative neurological ROS  negative psych ROS   GI/Hepatic Neg liver ROS, GERD  ,  Endo/Other  negative endocrine ROS  Renal/GU negative Renal ROS  negative genitourinary   Musculoskeletal negative musculoskeletal ROS (+)   Abdominal (+) + obese,   Peds negative pediatric ROS (+)  Hematology negative hematology ROS (+)   Anesthesia Other Findings   Reproductive/Obstetrics negative OB ROS                             Anesthesia Physical Anesthesia Plan  ASA: 2  Anesthesia Plan: General   Post-op Pain Management:  Regional for Post-op pain   Induction: Intravenous  PONV Risk Score and Plan: 2 and Ondansetron, Midazolam and Treatment may vary due to age or medical condition  Airway Management Planned: LMA  Additional Equipment:   Intra-op Plan:   Post-operative Plan: Extubation in OR  Informed Consent: I have reviewed the patients History and Physical, chart, labs and discussed the procedure including the risks, benefits and alternatives for the proposed anesthesia with the patient or authorized representative who has indicated his/her understanding and acceptance.     Dental advisory given  Plan Discussed with: CRNA  Anesthesia Plan Comments:         Anesthesia Quick Evaluation

## 2020-11-18 NOTE — Transfer of Care (Signed)
Immediate Anesthesia Transfer of Care Note  Patient: Micheal Hurley  Procedure(s) Performed: RIGHT SHOULDER ARTHROSCOPY WITH ROTATOR CUFF REPAIR AND SUBACROMIAL DECOMPRESSION, AND EXTENSIVE DEBRIDEMENT (Right: Shoulder)  Patient Location: PACU  Anesthesia Type:GA combined with regional for post-op pain  Level of Consciousness: drowsy and patient cooperative  Airway & Oxygen Therapy: Patient Spontanous Breathing and Patient connected to face mask oxygen  Post-op Assessment: Report given to RN and Post -op Vital signs reviewed and stable  Post vital signs: Reviewed and stable  Last Vitals:  Vitals Value Taken Time  BP    Temp    Pulse 76 11/18/20 1347  Resp 18 11/18/20 1347  SpO2 94 % 11/18/20 1347  Vitals shown include unvalidated device data.  Last Pain:  Vitals:   11/18/20 1028  TempSrc: Oral  PainSc: 0-No pain         Complications: No notable events documented.

## 2020-11-18 NOTE — Anesthesia Procedure Notes (Signed)
Procedure Name: LMA Insertion Date/Time: 11/18/2020 12:00 PM Performed by: Sheryn Bison, CRNA Pre-anesthesia Checklist: Patient identified, Emergency Drugs available, Suction available and Patient being monitored Patient Re-evaluated:Patient Re-evaluated prior to induction Oxygen Delivery Method: Circle System Utilized Preoxygenation: Pre-oxygenation with 100% oxygen Induction Type: IV induction Ventilation: Mask ventilation without difficulty LMA: LMA inserted LMA Size: 4.0 Number of attempts: 1 Airway Equipment and Method: bite block Placement Confirmation: positive ETCO2 Tube secured with: Tape Dental Injury: Teeth and Oropharynx as per pre-operative assessment

## 2020-11-18 NOTE — Progress Notes (Signed)
Assisted Dr. Foye with right, ultrasound guided, interscalene  block. Side rails up, monitors on throughout procedure. See vital signs in flow sheet. Tolerated Procedure well. 

## 2020-11-18 NOTE — H&P (Signed)
PREOPERATIVE H&P  Chief Complaint: right shoulder rotator cuff tear  HPI: Micheal Hurley is a 50 y.o. male who presents for surgical treatment of right shoulder rotator cuff tear.  He denies any changes in medical history.  Past Medical History:  Diagnosis Date   Anxiety    Depression    GERD (gastroesophageal reflux disease)    Hypertension    Substance abuse (HCC)    in remission since 2017   Past Surgical History:  Procedure Laterality Date   NO PAST SURGERIES     Social History   Socioeconomic History   Marital status: Married    Spouse name: Albin Felling   Number of children: Not on file   Years of education: Not on file   Highest education level: Not on file  Occupational History   Not on file  Tobacco Use   Smoking status: Former    Packs/day: 0.50    Years: 20.00    Pack years: 10.00    Types: Cigarettes    Quit date: 07/30/2016    Years since quitting: 4.3   Smokeless tobacco: Never  Vaping Use   Vaping Use: Never used  Substance and Sexual Activity   Alcohol use: No    Comment: history   Drug use: Yes    Comment: opiates - quit  August 2017   Sexual activity: Not on file  Other Topics Concern   Not on file  Social History Narrative   ** Merged History Encounter **       Social Determinants of Health   Financial Resource Strain: Not on file  Food Insecurity: Not on file  Transportation Needs: Not on file  Physical Activity: Not on file  Stress: Not on file  Social Connections: Not on file   Family History  Problem Relation Age of Onset   Mental illness Neg Hx    Allergies  Allergen Reactions   Bee Venom Anaphylaxis   Prior to Admission medications   Medication Sig Start Date End Date Taking? Authorizing Provider  baclofen (LIORESAL) 10 MG tablet Take 0.5-1 tablets (5-10 mg total) by mouth 3 (three) times daily as needed for muscle spasms. 09/25/20  Yes Hilts, Casimiro Needle, MD  esomeprazole (NEXIUM) 40 MG capsule Take by mouth. 05/10/19  Yes  [provider]  EPINEPHrine 0.3 mg/0.3 mL IJ SOAJ injection Inject 0.3 mLs (0.3 mg total) into the muscle as needed for anaphylaxis. 09/06/19   Placido Sou, PA-C  lidocaine (LIDODERM) 5 % Place 1 patch onto the skin daily. Remove & Discard patch within 12 hours or as directed by MD 09/21/20   Sponseller, Eugene Gavia, PA-C  meloxicam (MOBIC) 15 MG tablet TAKE 1 TABLET(15 MG) BY MOUTH DAILY 11/10/20   Vivi Barrack, DPM     Positive ROS: All other systems have been reviewed and were otherwise negative with the exception of those mentioned in the HPI and as above.  Physical Exam: General: Alert, no acute distress Cardiovascular: No pedal edema Respiratory: No cyanosis, no use of accessory musculature GI: abdomen soft Skin: No lesions in the area of chief complaint Neurologic: Sensation intact distally Psychiatric: Patient is competent for consent with normal mood and affect Lymphatic: no lymphedema  MUSCULOSKELETAL: exam stable  Assessment: right shoulder rotator cuff tear  Plan: Plan for Procedure(s): RIGHT SHOULDER ARTHROSCOPY WITH ROTATOR CUFF REPAIR AND SUBACROMIAL DECOMPRESSION, AND EXTENSIVE DEBRIDEMENT  The risks benefits and alternatives were discussed with the patient including but not limited to the risks of nonoperative  treatment, versus surgical intervention including infection, bleeding, nerve injury,  blood clots, cardiopulmonary complications, morbidity, mortality, among others, and they were willing to proceed.   Preoperative templating of the joint replacement has been completed, documented, and submitted to the Operating Room personnel in order to optimize intra-operative equipment management.   Glee Arvin, MD 11/18/2020 10:17 AM

## 2020-11-19 ENCOUNTER — Telehealth: Payer: Self-pay | Admitting: Orthopaedic Surgery

## 2020-11-19 ENCOUNTER — Encounter (HOSPITAL_BASED_OUTPATIENT_CLINIC_OR_DEPARTMENT_OTHER): Payer: Self-pay | Admitting: Orthopaedic Surgery

## 2020-11-19 NOTE — Telephone Encounter (Signed)
Patient wants to know if he can walk the track and/or walk on a treadmill

## 2020-11-20 ENCOUNTER — Telehealth: Payer: Self-pay | Admitting: Orthopaedic Surgery

## 2020-11-20 NOTE — Telephone Encounter (Signed)
Patient's wife called. Patient would like to know if he can take his muscle relaxer and pain pills? Would like someone to call back and let him know. [phone] is his number. Her number is [phone]

## 2020-11-20 NOTE — Telephone Encounter (Signed)
Patient aware.

## 2020-11-20 NOTE — Telephone Encounter (Signed)
Yes, but I would not take together.  Wait a few hours between taking

## 2020-11-21 ENCOUNTER — Other Ambulatory Visit: Payer: Self-pay

## 2020-11-21 ENCOUNTER — Emergency Department (HOSPITAL_COMMUNITY)
Admission: EM | Admit: 2020-11-21 | Discharge: 2020-11-21 | Disposition: A | Discharge status: Home / Self Care | Payer: BC Managed Care – PPO | Attending: Emergency Medicine | Admitting: Emergency Medicine

## 2020-11-21 ENCOUNTER — Encounter (HOSPITAL_COMMUNITY): Payer: Self-pay | Admitting: Emergency Medicine

## 2020-11-21 DIAGNOSIS — Z87891 Personal history of nicotine dependence: Secondary | ICD-10-CM | POA: Diagnosis not present

## 2020-11-21 DIAGNOSIS — I10 Essential (primary) hypertension: Secondary | ICD-10-CM | POA: Diagnosis not present

## 2020-11-21 DIAGNOSIS — M7989 Other specified soft tissue disorders: Secondary | ICD-10-CM | POA: Insufficient documentation

## 2020-11-21 DIAGNOSIS — R2231 Localized swelling, mass and lump, right upper limb: Secondary | ICD-10-CM

## 2020-11-21 DIAGNOSIS — M79641 Pain in right hand: Secondary | ICD-10-CM | POA: Diagnosis not present

## 2020-11-21 NOTE — ED Triage Notes (Signed)
Pt had rotator cuff surgery on Wednesday.  Tonight his right hand began to swell and he reports is tingling.  Good pulses and temperature.

## 2020-11-21 NOTE — ED Provider Notes (Signed)
[facility] EMERGENCY DEPARTMENT Provider Note   CSN: [id number] Arrival date & time: 11/21/20  0236     History Chief Complaint  Patient presents with   Hand Pain    Micheal Hurley is a 50 y.o. male.  Patient is a 50 year old male with a past medical history of recent right shoulder rotator cuff tear who is presenting for right hand swelling.  Patient states that he had shoulder Surgery on 8/24.  He states for the last day he has had swelling and tingling to his right hand.  He states that while sitting in the ED his right hand has improved in swelling.  He denies any changes in motor or sensation.  He denies any fevers or chills.  He is able to move his right hand with no difficulty.  He complains of paresthesias in his right hand.  Patient has follow-up with his orthopedic surgeon in 4 days. His pain has improved.   Patient denies any fevers, chill, headache, neck stiffness,  chest pain, shortness of breath, nausea, vomiting, diarrhea, numbness or weakness.    Hand Pain Pertinent negatives include no chest pain, no abdominal pain, no headaches and no shortness of breath.      Past Medical History:  Diagnosis Date   Anxiety    Depression    GERD (gastroesophageal reflux disease)    Hypertension    Substance abuse (HCC)    in remission since 2017    Patient Active Problem List   Diagnosis Date Noted   Traumatic tear of supraspinatus tendon, right, initial encounter 11/18/2020   Impingement syndrome of right shoulder 11/18/2020   Opioid use disorder, severe, dependence (HCC) 11/14/2015   MDD (major depressive disorder) 11/13/2015    Past Surgical History:  Procedure Laterality Date   NO PAST SURGERIES     SHOULDER ARTHROSCOPY WITH ROTATOR CUFF REPAIR AND SUBACROMIAL DECOMPRESSION Right 11/18/2020   Procedure: RIGHT SHOULDER ARTHROSCOPY WITH ROTATOR CUFF REPAIR AND SUBACROMIAL DECOMPRESSION, AND EXTENSIVE DEBRIDEMENT;  Surgeon: Tarry Kos, MD;   Location: [facility];  Service: Orthopedics;  Laterality: Right;       Family History  Problem Relation Age of Onset   Mental illness Neg Hx     Social History   Tobacco Use   Smoking status: Former    Packs/day: 0.50    Years: 20.00    Pack years: 10.00    Types: Cigarettes    Quit date: 07/30/2016    Years since quitting: 4.3   Smokeless tobacco: Never  Vaping Use   Vaping Use: Never used  Substance Use Topics   Alcohol use: No    Comment: history   Drug use: Yes    Comment: opiates - quit  August 2017    Home Medications Prior to Admission medications   Medication Sig Start Date End Date Taking? Authorizing Provider  baclofen (LIORESAL) 10 MG tablet Take 0.5-1 tablets (5-10 mg total) by mouth 3 (three) times daily as needed for muscle spasms. 09/25/20   Hilts, Casimiro Needle, MD  EPINEPHrine 0.3 mg/0.3 mL IJ SOAJ injection Inject 0.3 mLs (0.3 mg total) into the muscle as needed for anaphylaxis. 09/06/19   Placido Sou, PA-C  esomeprazole (NEXIUM) 40 MG capsule Take by mouth. 05/10/19   [provider]  lidocaine (LIDODERM) 5 % Place 1 patch onto the skin daily. Remove & Discard patch within 12 hours or as directed by MD 09/21/20   Sponseller, Eugene Gavia, PA-C  meloxicam (MOBIC) 15 MG  tablet TAKE 1 TABLET(15 MG) BY MOUTH DAILY 11/10/20   Vivi Barrack, DPM  oxyCODONE-acetaminophen (PERCOCET) 5-325 MG tablet Take 1-2 tablets by mouth every 8 (eight) hours as needed for severe pain. 11/18/20   Tarry Kos, MD    Allergies    Bee venom  Review of Systems   Review of Systems  Constitutional:  Negative for chills, diaphoresis, fatigue and fever.  HENT:  Negative for congestion, dental problem, ear pain, facial swelling, hearing loss, nosebleeds, postnasal drip, rhinorrhea, sore throat and trouble swallowing.   Eyes:  Negative for photophobia, pain and visual disturbance.  Respiratory:  Negative for apnea, cough, choking, chest tightness, shortness of  breath, wheezing and stridor.   Cardiovascular:  Negative for chest pain, palpitations and leg swelling.  Gastrointestinal:  Negative for abdominal distention, abdominal pain, constipation, diarrhea, nausea and vomiting.  Endocrine: Negative for polydipsia and polyuria.  Genitourinary:  Negative for difficulty urinating, dysuria, flank pain, frequency, hematuria and urgency.  Musculoskeletal:  Negative for gait problem, myalgias, neck pain and neck stiffness.       Right hand pain  Skin:  Negative for rash and wound.  Allergic/Immunologic: Negative for environmental allergies and food allergies.  Neurological:  Negative for dizziness, tremors, seizures, syncope, facial asymmetry, speech difficulty, light-headedness, numbness and headaches.  Psychiatric/Behavioral:  Negative for behavioral problems and confusion.   All other systems reviewed and are negative.  Physical Exam Updated Vital Signs BP (!) 148/82 (BP Location: Right Arm)   Pulse (!) 55   Temp 98 F (36.7 C) (Oral)   Resp 15   Ht 5\' 8"  (1.727 m)   Wt 101.2 kg   SpO2 100%   BMI 33.92 kg/m   Physical Exam Vitals and nursing note reviewed.  Constitutional:      General: He is not in acute distress.    Appearance: Normal appearance. He is normal weight.  HENT:     Head: Normocephalic and atraumatic.     Right Ear: External ear normal.     Left Ear: External ear normal.     Nose: Nose normal. No congestion.     Mouth/Throat:     Mouth: Mucous membranes are moist.     Pharynx: Oropharynx is clear. No oropharyngeal exudate or posterior oropharyngeal erythema.  Eyes:     General: No visual field deficit.    Extraocular Movements: Extraocular movements intact.     Conjunctiva/sclera: Conjunctivae normal.     Pupils: Pupils are equal, round, and reactive to light.  Cardiovascular:     Rate and Rhythm: Normal rate and regular rhythm.     Pulses: Normal pulses.     Heart sounds: Normal heart sounds. No murmur heard.   No  friction rub. No gallop.  Pulmonary:     Effort: Pulmonary effort is normal. No respiratory distress.     Breath sounds: Normal breath sounds. No stridor. No wheezing, rhonchi or rales.  Chest:     Chest wall: No tenderness.  Abdominal:     General: Abdomen is flat. Bowel sounds are normal. There is no distension.     Palpations: Abdomen is soft.     Tenderness: There is no abdominal tenderness. There is no right CVA tenderness, left CVA tenderness, guarding or rebound.  Musculoskeletal:        General: No swelling or tenderness. Normal range of motion.     Right wrist: Normal.     Left wrist: Normal.     Right hand: No swelling  or tenderness. Normal range of motion. Normal strength. Normal sensation. There is no disruption of two-point discrimination. Normal pulse.     Left hand: Normal.     Cervical back: Normal range of motion and neck supple. No rigidity, tenderness or bony tenderness.     Thoracic back: Normal. No tenderness or bony tenderness.     Lumbar back: Normal. No tenderness or bony tenderness.     Right lower leg: No edema.     Left lower leg: No edema.     Comments: Healing surgical scars on his right shoulder that are clean, dry and intact with no signs of infection.  Skin:    General: Skin is warm and dry.  Neurological:     General: No focal deficit present.     Mental Status: He is alert and oriented to person, place, and time. Mental status is at baseline.     Cranial Nerves: Cranial nerves are intact. No cranial nerve deficit, dysarthria or facial asymmetry.     Sensory: Sensation is intact. No sensory deficit.     Motor: Motor function is intact. No weakness.     Coordination: Coordination is intact. Finger-Nose-Finger Test normal.     Gait: Gait is intact. Gait normal.  Psychiatric:        Mood and Affect: Mood normal.        Behavior: Behavior normal.        Thought Content: Thought content normal.        Judgment: Judgment normal.    ED Results /  Procedures / Treatments   Labs (all labs ordered are listed, but only abnormal results are displayed) Labs Reviewed - No data to display  EKG None  Radiology No results found.  Procedures Procedures   Medications Ordered in ED Medications - No data to display  ED Course  I have reviewed the triage vital signs and the nursing notes.  Pertinent labs & imaging results that were available during my care of the patient were reviewed by me and considered in my medical decision making (see chart for details).    MDM Rules/Calculators/A&P                         Therese SarahCharles Grosch is a 50 y.o. male with past medical history of recent right shoulder rotator cuff tear is presenting for right hand swelling.  Patient is hemodynamically stable and in no acute distress.  Patient had rotator cuff surgery on 8/24.  He states that his right hand swelling has improved since being in the ED.  He has normal range of motion of his right hand.  He has no change in sensation.  He does endorse paresthesia to his right thumb and pointer finger.  There are no signs of compartment syndrome.  Patient is neurovascularly intact. I evaluated patient's right shoulder and there are no signs of infection. His incisions are clean, dry and intact. There is no swelling of his right hand compared to his left hand. No signs of blood clot.   Patient will follow up with his orthopedic surgeon on 8/31.  Patient states compliance and understanding of the plan. I explained labs and imaging to the patient. No further questions at this time from the patient.  The patient is safe and stable for discharge at this time with return precautions provided and a plan for follow-up care in place as needed  The plan for this patient was discussed with Dr. Freida BusmanAllen,  who voiced agreement and who oversaw evaluation and treatment of this patient.   Final Clinical Impression(s) / ED Diagnoses Final diagnoses:  Localized swelling on right hand     Rx / DC Orders ED Discharge Orders     None        Lottie Dawson, MD 11/21/20 1801    Lorre Nick, MD 11/24/20 [phone]

## 2020-11-21 NOTE — ED Provider Notes (Addendum)
I saw and evaluated the patient, reviewed the resident's note and I agree with the findings and plan.  50 year old male presents with right hand swelling.  Had rotator cuff surgery done Denies any focal weakness to his hand.  Swelling has improved.  No evidence of compartment syndrome.  On exam no evidence of infection.  His incision is clean dry intact.  We will have him follow-up with his surgeon   Lorre Nick, MD 11/21/20 1117    Lorre Nick, MD 11/21/20 [phone]

## 2020-11-21 NOTE — Discharge Instructions (Addendum)
Please follow-up with your orthopedic surgeon this week.  Please follow-up with your PCP.

## 2020-11-23 NOTE — Telephone Encounter (Signed)
Yes he can walk the track.

## 2020-11-23 NOTE — Telephone Encounter (Signed)
[facility] PT

## 2020-11-23 NOTE — Telephone Encounter (Signed)
Dr Warren Danes pt-had rotator cuff surgery last week but should be able to walk the track-would avoid the treadmill for now. Is Dr Roda Shutters out of town?

## 2020-11-25 ENCOUNTER — Ambulatory Visit (INDEPENDENT_AMBULATORY_CARE_PROVIDER_SITE_OTHER): Payer: BC Managed Care – PPO | Admitting: Physician Assistant

## 2020-11-25 ENCOUNTER — Encounter: Payer: Self-pay | Admitting: Orthopaedic Surgery

## 2020-11-25 ENCOUNTER — Other Ambulatory Visit: Payer: Self-pay

## 2020-11-25 DIAGNOSIS — Z9889 Other specified postprocedural states: Secondary | ICD-10-CM

## 2020-11-25 NOTE — Progress Notes (Signed)
   Post-Op Visit Note   Patient: Micheal Hurley           Date of Birth: 01/08/1971           MRN: [id number] Visit Date: 11/25/2020 PCP: Pcp, No   Assessment & Plan:  Chief Complaint:  Chief Complaint  Patient presents with   Right Shoulder - Pain   Visit Diagnoses:  1. S/P arthroscopy of right shoulder     Plan: Patient is a pleasant 50 year old gentleman who comes in today 1 week out right shoulder arthroscopic debridement rotator cuff repair supraspinatus 11/18/2020.  Has been doing well.  He has been compliant wearing the sling.  He has been taking oxycodone and Robaxin with relief of symptoms.  Examination of the right shoulder reveals fully healed surgical portals without complication.  Fingers are warm well perfused.  He is neurovascular intact distally.  Today, reinforced the need to continue wearing the sling for another 5 weeks.  Sutures removed and Steri-Strips applied.  Intraoperative pictures reviewed.  We would like to start him in physical therapy and he would like to attend [facility] however they cannot see him until 12/07/2020.  I will go ahead and make an internal referral as well as provide him with a hand written prescription.  He will follow-up with [place] in 5 weeks time for repeat evaluation.  Call with concerns or questions in the meantime.  Follow-Up Instructions: Return in about 5 weeks (around 12/30/2020).   Orders:  No orders of the defined types were placed in this encounter.  No orders of the defined types were placed in this encounter.   Imaging: No new imaging  PMFS History: Patient Active Problem List   Diagnosis Date Noted   Traumatic tear of supraspinatus tendon, right, initial encounter 11/18/2020   Impingement syndrome of right shoulder 11/18/2020   Opioid use disorder, severe, dependence (HCC) 11/14/2015   MDD (major depressive disorder) 11/13/2015   Past Medical History:  Diagnosis Date   Anxiety    Depression    GERD (gastroesophageal  reflux disease)    Hypertension    Substance abuse (HCC)    in remission since 2017    Family History  Problem Relation Age of Onset   Mental illness Neg Hx     Past Surgical History:  Procedure Laterality Date   NO PAST SURGERIES     SHOULDER ARTHROSCOPY WITH ROTATOR CUFF REPAIR AND SUBACROMIAL DECOMPRESSION Right 11/18/2020   Procedure: RIGHT SHOULDER ARTHROSCOPY WITH ROTATOR CUFF REPAIR AND SUBACROMIAL DECOMPRESSION, AND EXTENSIVE DEBRIDEMENT;  Surgeon: Tarry Kos, MD;  Location: [facility];  Service: Orthopedics;  Laterality: Right;   Social History   Occupational History   Not on file  Tobacco Use   Smoking status: Former    Packs/day: 0.50    Years: 20.00    Pack years: 10.00    Types: Cigarettes    Quit date: 07/30/2016    Years since quitting: 4.3   Smokeless tobacco: Never  Vaping Use   Vaping Use: Never used  Substance and Sexual Activity   Alcohol use: No    Comment: history   Drug use: Yes    Comment: opiates - quit  August 2017   Sexual activity: Not on file

## 2020-11-26 ENCOUNTER — Ambulatory Visit (INDEPENDENT_AMBULATORY_CARE_PROVIDER_SITE_OTHER): Payer: BC Managed Care – PPO | Admitting: Rehabilitative and Restorative Service Providers"

## 2020-11-26 ENCOUNTER — Encounter: Payer: Self-pay | Admitting: Rehabilitative and Restorative Service Providers"

## 2020-11-26 ENCOUNTER — Other Ambulatory Visit (INDEPENDENT_AMBULATORY_CARE_PROVIDER_SITE_OTHER): Payer: Self-pay | Admitting: Orthopaedic Surgery

## 2020-11-26 DIAGNOSIS — G8929 Other chronic pain: Secondary | ICD-10-CM

## 2020-11-26 DIAGNOSIS — M25511 Pain in right shoulder: Secondary | ICD-10-CM

## 2020-11-26 DIAGNOSIS — R600 Localized edema: Secondary | ICD-10-CM

## 2020-11-26 DIAGNOSIS — M6281 Muscle weakness (generalized): Secondary | ICD-10-CM

## 2020-11-26 NOTE — Telephone Encounter (Signed)
Patient aware.

## 2020-11-26 NOTE — Therapy (Signed)
[facility] Physical Therapy [address] [place], [place], [address] Phone: [phone]   Fax:  [phone]  Physical Therapy Evaluation  Patient Details  Name: Micheal Hurley MRN: [id number] Date of Birth: 21-Jun-1970 Referring Provider (PT): Cristie Hem, [place]   Encounter Date: 11/26/2020   PT End of Session - 11/26/20 1124     Visit Number 1    Number of Visits 20    Date for PT Re-Evaluation 02/04/21    PT Start Time 1128    PT Stop Time 1215    PT Time Calculation (min) 47 min    Activity Tolerance Patient tolerated treatment well    Behavior During Therapy [facility] for tasks assessed/performed             Past Medical History:  Diagnosis Date   Anxiety    Depression    GERD (gastroesophageal reflux disease)    Hypertension    Substance abuse (HCC)    in remission since 2017    Past Surgical History:  Procedure Laterality Date   NO PAST SURGERIES     SHOULDER ARTHROSCOPY WITH ROTATOR CUFF REPAIR AND SUBACROMIAL DECOMPRESSION Right 11/18/2020   Procedure: RIGHT SHOULDER ARTHROSCOPY WITH ROTATOR CUFF REPAIR AND SUBACROMIAL DECOMPRESSION, AND EXTENSIVE DEBRIDEMENT;  Surgeon: Tarry Kos, MD;  Location: [facility];  Service: Orthopedics;  Laterality: Right;    There were no vitals filed for this visit.    Subjective Assessment - 11/26/20 1133     Subjective Pt. comes to clinic s/p recent Rt shoulder supraspinatus repair and debridgement.  Pt. indicated trouble over time leading up to recent surgery.  Pt. indicated pain related to boxing in June that led to pain and ultimately.  Pt. stated since surgery he has taken half of pain pills usually to help symptoms.  Pt. stated his pain in usually in upper arm and elbow.  Wearing sling upon arrival to clinic.  Pt. indicated wearing sling most of the time.  Pt. stated trouble sleeping (has elevating bed).    Limitations Lifting;House hold activities    Patient Stated Goals Reduce pain     Currently in Pain? Yes    Pain Score 4    pain at worst 5/10 in last week   Pain Location Shoulder    Pain Orientation Right    Pain Descriptors / Indicators Throbbing;Spasm    Pain Type Chronic pain;Surgical pain    Pain Onset More than a month ago    Pain Frequency Intermittent    Aggravating Factors  lifting, reaching, sleeping, elbow in sling    Pain Relieving Factors medicine, icing    Effect of Pain on Daily Activities Limited in active Rt shoulder pain.                [facility] PT Assessment - 11/26/20 0001       Assessment   Medical Diagnosis Z98.890 (ICD-10-CM) - S/P arthroscopy of right shoulder    Referring Provider (PT) Cristie Hem, PA-C    Onset Date/Surgical Date 11/18/20    Hand Dominance Right      Precautions   Precaution Comments Standard RTC repair protocol (no strengthening until 5 weeks post op).  Wearing sling until 12/30/2020 per MD note      Balance Screen   Has the patient fallen in the past 6 months No    Has the patient had a decrease in activity level because of a fear of falling?  No    Is the  patient reluctant to leave their home because of a fear of falling?  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)    Geographical information systems officerLeisure Boxing, workouts with [employer]weights      Observation/Other Assessments   Observations Sling required adjustment to promote arm at side, wrist slightly elevated compared to elbow    Focus on Therapeutic Outcomes (FOTO)  intake 34%, predicted 66%      Sensation   Light Touch Appears Intact      Posture/Postural Control   Posture/Postural Control Postural limitations    Postural Limitations Rounded Shoulders      ROM / Strength   AROM / PROM / Strength Strength;PROM;AROM      AROM   Overall AROM Comments No AROM check on surgery shoulder per protocol  WFL on Lt GH joint    AROM Assessment Site Shoulder    Right/Left Shoulder Left;Right    Left Shoulder Internal  Rotation 85 Degrees   in 45 deg abduction in supine   Left Shoulder External Rotation 90 Degrees   in 45 deg abduction in supine     PROM   Overall PROM Comments limited by pain in all directions for Rt shoulder    PROM Assessment Site Shoulder    Right/Left Shoulder Left;Right    Right Shoulder Flexion 90 Degrees   in supine   Right Shoulder ABduction 80 Degrees   in supine   Right Shoulder Internal Rotation 45 Degrees   in 45 deg abduction in supine   Right Shoulder External Rotation 0 Degrees   in 45 deg abduction in supine     Strength   Overall Strength Comments No strength testing per protocol    Strength Assessment Site Shoulder;Elbow    Right/Left Shoulder Left;Right    Left Shoulder Flexion 5/5    Left Shoulder ABduction 5/5    Left Shoulder Internal Rotation 5/5    Left Shoulder External Rotation 5/5    Right/Left Elbow Right;Left    Left Elbow Flexion 5/5    Left Elbow Extension 5/5      Palpation   Palpation comment Tenderness anterior, superior and posterior Rt glenohumral joint.  Trigger points in Rt infraspinatus, posterior/middle/anterior deltoid                        Objective measurements completed on examination: See above findings.       [facility] Adult PT Treatment/Exercise - 11/26/20 0001       Self-Care   Self-Care Other Self-Care Comments    Other Self-Care Comments  Cues for routine heat/ice application as desired, sling wearing education ( through 12/30/2020)      Exercises   Exercises Other Exercises;Shoulder    Other Exercises  HEP instruction/performance c cues for techniques, handout provided.  Trial set performed of each for comprehension and symptom assessment.  HEP consisting of seated arm supported pendulums all directions, standing pendulums, scapular retraction, elbow flexion prom      Manual Therapy   Manual therapy comments g2-g3 inferior joint mobs, ap joint mobs to Rt glenohumeral joint, passive mobility to tolerance                     PT Education - 11/26/20 1221     Education Details HEP, poc    Person(s) Educated Patient    Methods Explanation;Demonstration;Verbal cues;Handout    Comprehension Returned demonstration;Verbalized understanding  PT Short Term Goals - 11/26/20 1124       PT SHORT TERM GOAL #1   Title Patient will demonstrate independent use of home exercise program to maintain progress from in clinic treatments.    Time 3    Period Weeks    Status New    Target Date 12/17/20               PT Long Term Goals - 11/26/20 1125       PT LONG TERM GOAL #1   Title Patient will demonstrate/report pain at worst less than or equal to 2/10 to facilitate minimal limitation in daily activity secondary to pain symptoms.    Time 10    Period Weeks    Status New    Target Date 02/04/21      PT LONG TERM GOAL #2   Title Patient will demonstrate independent use of home exercise program to facilitate ability to maintain/progress functional gains from skilled physical therapy services.    Time 10    Period Weeks    Status New    Target Date 02/04/21      PT LONG TERM GOAL #3   Title Pt. will demonstrate FOTO outcome > or = 66% to indicated reduced disability due to condition.    Time 10    Period Weeks    Status New    Target Date 02/04/21      PT LONG TERM GOAL #4   Title Patient will demonstrate Rt GH joint mobility WFL to facilitate usual self care, dressing, reaching overhead at PLOF s limitation due to symptoms.    Time 10    Period Weeks    Status New    Target Date 02/04/21      PT LONG TERM GOAL #5   Title Patient will demonstrate Rt UE MMT 4/5 or greater throughout to facilitate usual lifting, carrying in functional activity to PLOF s limitation.    Time 10    Period Weeks    Status New    Target Date 02/04/21                    Plan - 11/26/20 1122     Clinical Impression Statement Patient is a  50 y.o. who comes to  clinic with complaints of Rt shoulder pain with mobility, strength and movement coordination deficits s/p recent surgery on 11/18/2020 that impair their ability to perform usual daily and recreational functional activities without increase difficulty/symptoms at this time.  Patient to benefit from skilled PT services to address impairments and limitations to improve to previous level of function without restriction secondary to condition.    Personal Factors and Comorbidities Comorbidity 1    Comorbidities HTN    Examination-Activity Limitations Caring for Others;Sleep;Bed Mobility;Carry;Dressing;Hygiene/Grooming;Lift;Reach Overhead    Examination-Participation Restrictions Cleaning;Community Activity;Driving;Interpersonal Relationship;Yard Work;Laundry;Meal Prep    Stability/Clinical Decision Making Stable/Uncomplicated    Clinical Decision Making Low    Rehab Potential Good    PT Frequency --   1-2x/week   PT Duration Other (comment)   10 weeks   PT Treatment/Interventions ADLs/Self Care Home Management;Cryotherapy;Electrical Stimulation;Iontophoresis 4mg /ml Dexamethasone;Moist Heat;Balance training;Therapeutic exercise;Therapeutic activities;Functional mobility training;Ultrasound;Neuromuscular re-education;Passive range of motion;Spinal Manipulations;Joint Manipulations;Dry needling;Patient/family education;Taping;Vasopneumatic Device;Manual techniques    PT Next Visit Plan Passive mobility only at this time    PT Home Exercise Plan [facility]    Consulted and Agree with Plan of Care Patient  Patient will benefit from skilled therapeutic intervention in order to improve the following deficits and impairments:  Decreased endurance, Hypomobility, Increased edema, Pain, Impaired UE functional use, Decreased strength, Decreased activity tolerance, Decreased mobility, Impaired perceived functional ability, Improper body mechanics, Impaired flexibility, Decreased range of motion, Decreased  coordination  Visit Diagnosis: Chronic right shoulder pain  Muscle weakness (generalized)  Localized edema     Problem List Patient Active Problem List   Diagnosis Date Noted   Traumatic tear of supraspinatus tendon, right, initial encounter 11/18/2020   Impingement syndrome of right shoulder 11/18/2020   Opioid use disorder, severe, dependence (HCC) 11/14/2015   MDD (major depressive disorder) 11/13/2015    Chyrel Masson, PT, DPT, OCS, ATC 11/26/20  12:22 PM    [facility] Physical Therapy [address] [place], [place], [address] Phone: [phone]   Fax:  [phone]  Name: Michah Minton MRN: [id number] Date of Birth: 1970-08-18

## 2020-11-26 NOTE — Patient Instructions (Signed)
Access Code: [email or web address] URL: [email or web address] Date: 11/26/2020 Prepared by: Chyrel Masson  Exercises Seated Scapular Retraction - 3 x daily - 7 x weekly - 2 sets - 10 reps - 5 hold Circular Shoulder Pendulum with Table Support - 3 x daily - 7 x weekly - 3 sets - 10 reps Standing Circular Shoulder Pendulum Supported with Arm Bent - 3 x daily - 7 x weekly - 1 sets - 20 reps Standing Flexion Extension Shoulder Pendulum Supported with Arm Bent - 3 x daily - 7 x weekly - 1 sets - 20 reps Supported Elbow Flexion Extension PROM (Mirrored) - 1 x daily - 7 x weekly - 3 sets - 10 reps

## 2020-12-01 ENCOUNTER — Telehealth: Payer: Self-pay | Admitting: Orthopaedic Surgery

## 2020-12-01 ENCOUNTER — Encounter: Payer: Self-pay | Admitting: Physical Therapy

## 2020-12-01 ENCOUNTER — Other Ambulatory Visit: Payer: Self-pay

## 2020-12-01 ENCOUNTER — Ambulatory Visit (INDEPENDENT_AMBULATORY_CARE_PROVIDER_SITE_OTHER): Payer: BC Managed Care – PPO | Admitting: Physical Therapy

## 2020-12-01 ENCOUNTER — Other Ambulatory Visit: Payer: Self-pay | Admitting: Physician Assistant

## 2020-12-01 DIAGNOSIS — M6281 Muscle weakness (generalized): Secondary | ICD-10-CM | POA: Diagnosis not present

## 2020-12-01 DIAGNOSIS — G8929 Other chronic pain: Secondary | ICD-10-CM | POA: Diagnosis not present

## 2020-12-01 DIAGNOSIS — M25511 Pain in right shoulder: Secondary | ICD-10-CM

## 2020-12-01 DIAGNOSIS — R600 Localized edema: Secondary | ICD-10-CM

## 2020-12-01 MED ORDER — OXYCODONE-ACETAMINOPHEN 5-325 MG PO TABS: 1.0000 | ORAL_TABLET | Freq: Three times a day (TID) | ORAL | 0 refills | Status: DC | PRN

## 2020-12-01 NOTE — Telephone Encounter (Signed)
Called and advised.

## 2020-12-01 NOTE — Telephone Encounter (Signed)
Patient would like a refill on oxycodone called in to [facility]

## 2020-12-01 NOTE — Telephone Encounter (Signed)
Sent in

## 2020-12-01 NOTE — Therapy (Signed)
[facility] Physical Therapy [address] [place], [place], [address] Phone: [phone]   Fax:  [phone]  Physical Therapy Treatment  Patient Details  Name: Micheal Hurley MRN: [id number] Date of Birth: [birth year] Referring Provider (PT): Cristie Hem, [place]   Encounter Date: 12/01/2020   PT End of Session - 12/01/20 1258     Visit Number 2    Number of Visits 20    Date for PT Re-Evaluation 02/04/21    PT Start Time 1145    PT Stop Time 1225    PT Time Calculation (min) 40 min    Activity Tolerance Patient tolerated treatment well    Behavior During Therapy [facility] for tasks assessed/performed             Past Medical History:  Diagnosis Date   Anxiety    Depression    GERD (gastroesophageal reflux disease)    Hypertension    Substance abuse (HCC)    in remission since 2017    Past Surgical History:  Procedure Laterality Date   NO PAST SURGERIES     SHOULDER ARTHROSCOPY WITH ROTATOR CUFF REPAIR AND SUBACROMIAL DECOMPRESSION Right 11/18/2020   Procedure: RIGHT SHOULDER ARTHROSCOPY WITH ROTATOR CUFF REPAIR AND SUBACROMIAL DECOMPRESSION, AND EXTENSIVE DEBRIDEMENT;  Surgeon: Tarry Kos, MD;  Location: [facility];  Service: Orthopedics;  Laterality: Right;    There were no vitals filed for this visit.   Subjective Assessment - 12/01/20 1254     Subjective Pt arriving reporting compliance with his sling wearing. Pt reporting 5/10 pain and reports he has been trying not to use full pain prescription.    Limitations Lifting;House hold activities    Patient Stated Goals Reduce pain    Currently in Pain? Yes    Pain Score 5     Pain Location Shoulder    Pain Orientation Right    Pain Descriptors / Indicators Aching;Sore;Tightness    Pain Onset More than a month ago                [facility] PT Assessment - 12/01/20 0001       Assessment   Medical Diagnosis Z98.890 (ICD-10-CM) - S/P arthroscopy of right shoulder     Referring Provider (PT) Cristie Hem, PA-C    Onset Date/Surgical Date 11/18/20    Hand Dominance Right      Precautions   Precaution Comments Standard RTC repair protocol (no strengthening until 5 weeks post op).  Wearing sling until 12/30/2020 per MD note      PROM   Overall PROM Comments supine    PROM Assessment Site Shoulder    Right/Left Shoulder Right    Right Shoulder Flexion 110 Degrees    Right Shoulder ABduction 90 Degrees    Right Shoulder Internal Rotation 45 Degrees   shoulder abd 45 degrees   Right Shoulder External Rotation 15 Degrees   shoulder abd 45 degrees                          [facility] Adult PT Treatment/Exercise - 12/01/20 0001       Shoulder Exercises: Standing   Other Standing Exercises pendulums both directions      Modalities   Modalities Vasopneumatic      Vasopneumatic   Number Minutes Vasopneumatic  10 minutes    Vasopnuematic Location  Shoulder    Vasopneumatic Pressure Low    Vasopneumatic Temperature  34  Manual Therapy   Manual therapy comments g2-g3 inferior joint mobs, ap joint mobs to Rt glenohumeral joint, passive mobility to tolerance, STM to right upper trap   25 minutes                     PT Short Term Goals - 12/01/20 1257       PT SHORT TERM GOAL #1   Title Patient will demonstrate independent use of home exercise program to maintain progress from in clinic treatments.    Status On-going               PT Long Term Goals - 11/26/20 1125       PT LONG TERM GOAL #1   Title Patient will demonstrate/report pain at worst less than or equal to 2/10 to facilitate minimal limitation in daily activity secondary to pain symptoms.    Time 10    Period Weeks    Status New    Target Date 02/04/21      PT LONG TERM GOAL #2   Title Patient will demonstrate independent use of home exercise program to facilitate ability to maintain/progress functional gains from skilled physical therapy  services.    Time 10    Period Weeks    Status New    Target Date 02/04/21      PT LONG TERM GOAL #3   Title Pt. will demonstrate FOTO outcome > or = 66% to indicated reduced disability due to condition.    Time 10    Period Weeks    Status New    Target Date 02/04/21      PT LONG TERM GOAL #4   Title Patient will demonstrate Rt GH joint mobility WFL to facilitate usual self care, dressing, reaching overhead at PLOF s limitation due to symptoms.    Time 10    Period Weeks    Status New    Target Date 02/04/21      PT LONG TERM GOAL #5   Title Patient will demonstrate Rt UE MMT 4/5 or greater throughout to facilitate usual lifting, carrying in functional activity to PLOF s limitation.    Time 10    Period Weeks    Status New    Target Date 02/04/21                   Plan - 12/01/20 1255     Clinical Impression Statement Pt arriving today with 5/10 pain. Pt wearing his right arm sling. Pt accompanied by his daughter. Pt tolerating PROM well with limitations still due to pain. Continue skilled PT and progress as protocol allows.    Personal Factors and Comorbidities Comorbidity 1    Comorbidities HTN    Examination-Activity Limitations Caring for Others;Sleep;Bed Mobility;Carry;Dressing;Hygiene/Grooming;Lift;Reach Overhead    Examination-Participation Restrictions Cleaning;Community Activity;Driving;Interpersonal Relationship;Yard Work;Laundry;Meal Prep    Stability/Clinical Decision Making Stable/Uncomplicated    Rehab Potential Good    PT Duration Other (comment)    PT Treatment/Interventions ADLs/Self Care Home Management;Cryotherapy;Electrical Stimulation;Iontophoresis 4mg /ml Dexamethasone;Moist Heat;Balance training;Therapeutic exercise;Therapeutic activities;Functional mobility training;Ultrasound;Neuromuscular re-education;Passive range of motion;Spinal Manipulations;Joint Manipulations;Dry needling;Patient/family education;Taping;Vasopneumatic Device;Manual  techniques    PT Next Visit Plan Passive mobility only at this time    PT Home Exercise Plan [facility]    Consulted and Agree with Plan of Care Patient             Patient will benefit from skilled therapeutic intervention in order to improve the following deficits and impairments:  Decreased  endurance, Hypomobility, Increased edema, Pain, Impaired UE functional use, Decreased strength, Decreased activity tolerance, Decreased mobility, Impaired perceived functional ability, Improper body mechanics, Impaired flexibility, Decreased range of motion, Decreased coordination  Visit Diagnosis: Chronic right shoulder pain  Muscle weakness (generalized)  Localized edema     Problem List Patient Active Problem List   Diagnosis Date Noted   Traumatic tear of supraspinatus tendon, right, initial encounter 11/18/2020   Impingement syndrome of right shoulder 11/18/2020   Opioid use disorder, severe, dependence (HCC) 11/14/2015   MDD (major depressive disorder) 11/13/2015    Sharmon Leyden, PT, MPT 12/01/2020, 12:59 PM  [facility] Physical Therapy [address] [place], [place], [address] Phone: [phone]   Fax:  [phone]  Name: Ly Bacchi MRN: [id number] Date of Birth: 05/24/1970

## 2020-12-01 NOTE — Telephone Encounter (Signed)
Please advise 

## 2020-12-01 NOTE — Telephone Encounter (Signed)
See message. thanks 

## 2020-12-02 ENCOUNTER — Other Ambulatory Visit: Payer: Self-pay | Admitting: Physician Assistant

## 2020-12-02 ENCOUNTER — Encounter: Payer: BC Managed Care – PPO | Admitting: Rehabilitative and Restorative Service Providers"

## 2020-12-02 NOTE — Telephone Encounter (Signed)
I sent this in yesterday

## 2020-12-04 ENCOUNTER — Ambulatory Visit (INDEPENDENT_AMBULATORY_CARE_PROVIDER_SITE_OTHER): Payer: BC Managed Care – PPO | Admitting: Physical Therapy

## 2020-12-04 ENCOUNTER — Encounter: Payer: Self-pay | Admitting: Physical Therapy

## 2020-12-04 ENCOUNTER — Other Ambulatory Visit: Payer: Self-pay

## 2020-12-04 DIAGNOSIS — M25511 Pain in right shoulder: Secondary | ICD-10-CM

## 2020-12-04 DIAGNOSIS — R600 Localized edema: Secondary | ICD-10-CM

## 2020-12-04 DIAGNOSIS — G8929 Other chronic pain: Secondary | ICD-10-CM

## 2020-12-04 DIAGNOSIS — M6281 Muscle weakness (generalized): Secondary | ICD-10-CM | POA: Diagnosis not present

## 2020-12-04 NOTE — Therapy (Signed)
[facility] Physical Therapy [address] [place], [place], [address] Phone: [phone]   Fax:  [phone]  Physical Therapy Treatment  Patient Details  Name: Micheal Hurley MRN: [id number] Date of Birth: 16-Feb-1971 Referring Provider (PT): Cristie Hem, [place]   Encounter Date: 12/04/2020   PT End of Session - 12/04/20 1437     Visit Number 3    Number of Visits 20    Date for PT Re-Evaluation 02/04/21    PT Start Time 1307   Patient 7 minutes late   PT Stop Time 1345    PT Time Calculation (min) 38 min    Activity Tolerance Patient tolerated treatment well    Behavior During Therapy [facility] for tasks assessed/performed             Past Medical History:  Diagnosis Date   Anxiety    Depression    GERD (gastroesophageal reflux disease)    Hypertension    Substance abuse (HCC)    in remission since 2017    Past Surgical History:  Procedure Laterality Date   NO PAST SURGERIES     SHOULDER ARTHROSCOPY WITH ROTATOR CUFF REPAIR AND SUBACROMIAL DECOMPRESSION Right 11/18/2020   Procedure: RIGHT SHOULDER ARTHROSCOPY WITH ROTATOR CUFF REPAIR AND SUBACROMIAL DECOMPRESSION, AND EXTENSIVE DEBRIDEMENT;  Surgeon: Tarry Kos, MD;  Location: [facility];  Service: Orthopedics;  Laterality: Right;    There were no vitals filed for this visit.   Subjective Assessment - 12/04/20 1314     Subjective Patient reports it is much more sore when he is in the sling. He was advised, he needs to stay in the sling.    Limitations Lifting;House hold activities    Patient Stated Goals Reduce pain    Currently in Pain? Yes    Pain Score 5     Pain Location Shoulder    Pain Orientation Right    Pain Descriptors / Indicators Aching    Pain Type Chronic pain    Pain Onset More than a month ago    Aggravating Factors  lifting, reaching    Pain Relieving Factors medicine    Multiple Pain Sites No                               [facility]  Adult PT Treatment/Exercise - 12/04/20 0001       Modalities   Modalities Vasopneumatic      Vasopneumatic   Number Minutes Vasopneumatic  15 minutes    Vasopnuematic Location  Shoulder    Vasopneumatic Pressure Low    Vasopneumatic Temperature  34      Manual Therapy   Manual therapy comments g2-g3 inferior joint mobs, ap joint mobs to Rt glenohumeral joint, passive mobility to tolerance, STM to right upper trap                     PT Education - 12/04/20 1435     Education Details reviewed POC, improtance of protection phase    Person(s) Educated Patient    Methods Explanation;Demonstration;Verbal cues;Tactile cues    Comprehension Verbalized understanding;Returned demonstration;Verbal cues required;Tactile cues required              PT Short Term Goals - 12/01/20 1257       PT SHORT TERM GOAL #1   Title Patient will demonstrate independent use of home exercise program to maintain progress from in clinic treatments.  Status On-going               PT Long Term Goals - 11/26/20 1125       PT LONG TERM GOAL #1   Title Patient will demonstrate/report pain at worst less than or equal to 2/10 to facilitate minimal limitation in daily activity secondary to pain symptoms.    Time 10    Period Weeks    Status New    Target Date 02/04/21      PT LONG TERM GOAL #2   Title Patient will demonstrate independent use of home exercise program to facilitate ability to maintain/progress functional gains from skilled physical therapy services.    Time 10    Period Weeks    Status New    Target Date 02/04/21      PT LONG TERM GOAL #3   Title Pt. will demonstrate FOTO outcome > or = 66% to indicated reduced disability due to condition.    Time 10    Period Weeks    Status New    Target Date 02/04/21      PT LONG TERM GOAL #4   Title Patient will demonstrate Rt GH joint mobility WFL to facilitate usual self care, dressing, reaching overhead at PLOF s  limitation due to symptoms.    Time 10    Period Weeks    Status New    Target Date 02/04/21      PT LONG TERM GOAL #5   Title Patient will demonstrate Rt UE MMT 4/5 or greater throughout to facilitate usual lifting, carrying in functional activity to PLOF s limitation.    Time 10    Period Weeks    Status New    Target Date 02/04/21                   Plan - 12/04/20 1440     Clinical Impression Statement Patient tolerated treatment well. Therapy focused on manual therapy. He is erfroming his HEP at home and he was limited byt time today. He started off with expected limitations in ER and flexion but improved with manual therapy.    Personal Factors and Comorbidities Comorbidity 1    Comorbidities HTN    Examination-Activity Limitations Caring for Others;Sleep;Bed Mobility;Carry;Dressing;Hygiene/Grooming;Lift;Reach Overhead    Examination-Participation Restrictions Cleaning;Community Activity;Driving;Interpersonal Relationship;Yard Work;Laundry;Meal Prep    Stability/Clinical Decision Making Stable/Uncomplicated    Clinical Decision Making Low    Rehab Potential Good    PT Duration Other (comment)    PT Treatment/Interventions ADLs/Self Care Home Management;Cryotherapy;Electrical Stimulation;Iontophoresis 4mg /ml Dexamethasone;Moist Heat;Balance training;Therapeutic exercise;Therapeutic activities;Functional mobility training;Ultrasound;Neuromuscular re-education;Passive range of motion;Spinal Manipulations;Joint Manipulations;Dry needling;Patient/family education;Taping;Vasopneumatic Device;Manual techniques    PT Next Visit Plan Passive mobility only at this time    PT Home Exercise Plan [facility]    Consulted and Agree with Plan of Care Patient             Patient will benefit from skilled therapeutic intervention in order to improve the following deficits and impairments:  Decreased endurance, Hypomobility, Increased edema, Pain, Impaired UE functional use, Decreased  strength, Decreased activity tolerance, Decreased mobility, Impaired perceived functional ability, Improper body mechanics, Impaired flexibility, Decreased range of motion, Decreased coordination  Visit Diagnosis: Chronic right shoulder pain  Muscle weakness (generalized)  Localized edema     Problem List Patient Active Problem List   Diagnosis Date Noted   Traumatic tear of supraspinatus tendon, right, initial encounter 11/18/2020   Impingement syndrome of right shoulder 11/18/2020  Opioid use disorder, severe, dependence (HCC) 11/14/2015   MDD (major depressive disorder) 11/13/2015    Dessie Coma, PT DPT  12/04/2020, 3:02 PM  [facility] Physical Therapy [address] [place], [place], [address] Phone: [phone]   Fax:  [phone]  Name: Koby Pickup MRN: [id number] Date of Birth: [birth year]

## 2020-12-08 ENCOUNTER — Other Ambulatory Visit: Payer: Self-pay

## 2020-12-08 ENCOUNTER — Encounter: Payer: Self-pay | Admitting: Rehabilitative and Restorative Service Providers"

## 2020-12-08 ENCOUNTER — Ambulatory Visit (INDEPENDENT_AMBULATORY_CARE_PROVIDER_SITE_OTHER): Payer: BC Managed Care – PPO | Admitting: Rehabilitative and Restorative Service Providers"

## 2020-12-08 DIAGNOSIS — M6281 Muscle weakness (generalized): Secondary | ICD-10-CM

## 2020-12-08 DIAGNOSIS — R600 Localized edema: Secondary | ICD-10-CM | POA: Diagnosis not present

## 2020-12-08 DIAGNOSIS — M25511 Pain in right shoulder: Secondary | ICD-10-CM

## 2020-12-08 DIAGNOSIS — G8929 Other chronic pain: Secondary | ICD-10-CM | POA: Diagnosis not present

## 2020-12-08 NOTE — Therapy (Signed)
[facility] Physical Therapy [address] [place], [place], [address] Phone: [phone]   Fax:  [phone]  Physical Therapy Treatment  Patient Details  Name: Micheal Hurley MRN: [id number] Date of Birth: Apr 26, 1970 Referring Provider (PT): Cristie Hem, [place]   Encounter Date: 12/08/2020   PT End of Session - 12/08/20 0928     Visit Number 4    Number of Visits 20    Date for PT Re-Evaluation 02/04/21    PT Start Time 0928    PT Stop Time 1006    PT Time Calculation (min) 38 min    Activity Tolerance Patient tolerated treatment well    Behavior During Therapy [facility] for tasks assessed/performed             Past Medical History:  Diagnosis Date   Anxiety    Depression    GERD (gastroesophageal reflux disease)    Hypertension    Substance abuse (HCC)    in remission since 2017    Past Surgical History:  Procedure Laterality Date   NO PAST SURGERIES     SHOULDER ARTHROSCOPY WITH ROTATOR CUFF REPAIR AND SUBACROMIAL DECOMPRESSION Right 11/18/2020   Procedure: RIGHT SHOULDER ARTHROSCOPY WITH ROTATOR CUFF REPAIR AND SUBACROMIAL DECOMPRESSION, AND EXTENSIVE DEBRIDEMENT;  Surgeon: Tarry Kos, MD;  Location: [facility];  Service: Orthopedics;  Laterality: Right;    There were no vitals filed for this visit.   Subjective Assessment - 12/08/20 0953     Subjective Pt. indicated feeling no real pain at rest upon arrival today.  Pt. indicated he had some spots of pain in lateral shoulder at times.  Also noted letting shoulder move back while lying on back is trouble too.    Limitations Lifting;House hold activities    Patient Stated Goals Reduce pain    Currently in Pain? No/denies    Pain Score 0-No pain    Pain Location Shoulder    Pain Orientation Right    Pain Descriptors / Indicators Aching;Sore    Pain Type Chronic pain    Pain Onset More than a month ago    Pain Frequency Intermittent    Aggravating Factors  lying on back     Pain Relieving Factors rest, medicine                [facility] PT Assessment - 12/08/20 0001       Assessment   Medical Diagnosis Z98.890 (ICD-10-CM) - S/P arthroscopy of right shoulder    Referring Provider (PT) Cristie Hem, PA-C    Onset Date/Surgical Date 11/18/20    Hand Dominance Right      Precautions   Precaution Comments Standard RTC repair protocol (no strengthening until 5 weeks post op).  Wearing sling until 12/30/2020 per MD note      PROM   Right Shoulder Flexion 120 Degrees   in supine   Right Shoulder External Rotation 30 Degrees   in supine in 45 degree abduction                          [facility] Adult PT Treatment/Exercise - 12/08/20 0001       Exercises   Other Exercises  Review of existing HEP techniques      Shoulder Exercises: Supine   Other Supine Exercises passive Rt shoulder flexion c Lt arm assist 2 x 10      Shoulder Exercises: Seated   Other Seated Exercises scapular retraction 5 sec  x 15, seated passive Rt elbow flexion/extension x 20      Manual Therapy   Manual therapy comments g3 inferior jt mobs, mobilization c movement to Rt Gh jt c ER in 45 deg abduction to tolerance, prom                       PT Short Term Goals - 12/01/20 1257       PT SHORT TERM GOAL #1   Title Patient will demonstrate independent use of home exercise program to maintain progress from in clinic treatments.    Status On-going               PT Long Term Goals - 11/26/20 1125       PT LONG TERM GOAL #1   Title Patient will demonstrate/report pain at worst less than or equal to 2/10 to facilitate minimal limitation in daily activity secondary to pain symptoms.    Time 10    Period Weeks    Status New    Target Date 02/04/21      PT LONG TERM GOAL #2   Title Patient will demonstrate independent use of home exercise program to facilitate ability to maintain/progress functional gains from skilled physical therapy services.     Time 10    Period Weeks    Status New    Target Date 02/04/21      PT LONG TERM GOAL #3   Title Pt. will demonstrate FOTO outcome > or = 66% to indicated reduced disability due to condition.    Time 10    Period Weeks    Status New    Target Date 02/04/21      PT LONG TERM GOAL #4   Title Patient will demonstrate Rt GH joint mobility WFL to facilitate usual self care, dressing, reaching overhead at PLOF s limitation due to symptoms.    Time 10    Period Weeks    Status New    Target Date 02/04/21      PT LONG TERM GOAL #5   Title Patient will demonstrate Rt UE MMT 4/5 or greater throughout to facilitate usual lifting, carrying in functional activity to PLOF s limitation.    Time 10    Period Weeks    Status New    Target Date 02/04/21                   Plan - 12/08/20 1002     Clinical Impression Statement Passive mobility gained as noted in measurements.  Reducing restriction due to muscle guarding at this time but still noted at times.  Continue c plan for passive mobility gains within protocol at this time.    Personal Factors and Comorbidities Comorbidity 1    Comorbidities HTN    Examination-Activity Limitations Caring for Others;Sleep;Bed Mobility;Carry;Dressing;Hygiene/Grooming;Lift;Reach Overhead    Examination-Participation Restrictions Cleaning;Community Activity;Driving;Interpersonal Relationship;Yard Work;Laundry;Meal Prep    Stability/Clinical Decision Making Stable/Uncomplicated    Rehab Potential Good    PT Duration Other (comment)    PT Treatment/Interventions ADLs/Self Care Home Management;Cryotherapy;Electrical Stimulation;Iontophoresis 4mg /ml Dexamethasone;Moist Heat;Balance training;Therapeutic exercise;Therapeutic activities;Functional mobility training;Ultrasound;Neuromuscular re-education;Passive range of motion;Spinal Manipulations;Joint Manipulations;Dry needling;Patient/family education;Taping;Vasopneumatic Device;Manual techniques    PT  Next Visit Plan Passive mobility per protocol for motion gains.    PT Home Exercise Plan    Consulted and Agree with Plan of Care Patient             Patient will benefit from skilled  therapeutic intervention in order to improve the following deficits and impairments:  Decreased endurance, Hypomobility, Increased edema, Pain, Impaired UE functional use, Decreased strength, Decreased activity tolerance, Decreased mobility, Impaired perceived functional ability, Improper body mechanics, Impaired flexibility, Decreased range of motion, Decreased coordination  Visit Diagnosis: Chronic right shoulder pain  Muscle weakness (generalized)  Localized edema     Problem List Patient Active Problem List   Diagnosis Date Noted   Traumatic tear of supraspinatus tendon, right, initial encounter 11/18/2020   Impingement syndrome of right shoulder 11/18/2020   Opioid use disorder, severe, dependence (HCC) 11/14/2015   MDD (major depressive disorder) 11/13/2015   Chyrel Masson, PT, DPT, OCS, ATC 12/08/20  10:06 AM    [facility] Physical Therapy [address] [place], [place], [address] Phone: [phone]   Fax:  [phone]  Name: Micheal Hurley MRN: [id number] Date of Birth: [birth year]

## 2020-12-10 ENCOUNTER — Ambulatory Visit (INDEPENDENT_AMBULATORY_CARE_PROVIDER_SITE_OTHER): Payer: BC Managed Care – PPO | Admitting: Rehabilitative and Restorative Service Providers"

## 2020-12-10 ENCOUNTER — Encounter: Payer: Self-pay | Admitting: Rehabilitative and Restorative Service Providers"

## 2020-12-10 ENCOUNTER — Other Ambulatory Visit: Payer: Self-pay

## 2020-12-10 DIAGNOSIS — M6281 Muscle weakness (generalized): Secondary | ICD-10-CM | POA: Diagnosis not present

## 2020-12-10 DIAGNOSIS — M25511 Pain in right shoulder: Secondary | ICD-10-CM

## 2020-12-10 DIAGNOSIS — G8929 Other chronic pain: Secondary | ICD-10-CM | POA: Diagnosis not present

## 2020-12-10 DIAGNOSIS — R600 Localized edema: Secondary | ICD-10-CM

## 2020-12-10 NOTE — Therapy (Signed)
[facility] Physical Therapy [address] [place], [place], [address] Phone: [phone]   Fax:  [phone]  Physical Therapy Treatment  Patient Details  Name: Micheal Hurley MRN: [id number] Date of Birth: 25-Dec-1970 Referring Provider (PT): Cristie Hem, [place]   Encounter Date: 12/10/2020   PT End of Session - 12/10/20 1109     Visit Number 5    Number of Visits 20    Date for PT Re-Evaluation 02/04/21    PT Start Time 1102    PT Stop Time 1142    PT Time Calculation (min) 40 min    Activity Tolerance Patient tolerated treatment well    Behavior During Therapy [facility] for tasks assessed/performed             Past Medical History:  Diagnosis Date   Anxiety    Depression    GERD (gastroesophageal reflux disease)    Hypertension    Substance abuse (HCC)    in remission since 2017    Past Surgical History:  Procedure Laterality Date   NO PAST SURGERIES     SHOULDER ARTHROSCOPY WITH ROTATOR CUFF REPAIR AND SUBACROMIAL DECOMPRESSION Right 11/18/2020   Procedure: RIGHT SHOULDER ARTHROSCOPY WITH ROTATOR CUFF REPAIR AND SUBACROMIAL DECOMPRESSION, AND EXTENSIVE DEBRIDEMENT;  Surgeon: Tarry Kos, MD;  Location: [facility];  Service: Orthopedics;  Laterality: Right;    There were no vitals filed for this visit.   Subjective Assessment - 12/10/20 1107     Subjective Pt. indicated no pain at rest.  Still getting pains c various movements.    Limitations Lifting;House hold activities    Patient Stated Goals Reduce pain    Currently in Pain? No/denies    Pain Score 0-No pain    Pain Location Shoulder    Pain Descriptors / Indicators Aching;Sore   catch   Pain Type Chronic pain    Pain Onset More than a month ago    Pain Frequency Intermittent    Aggravating Factors  lying on back, end range movements, catches of pain in middle of movements at times    Pain Relieving Factors rest                                [facility] Adult PT Treatment/Exercise - 12/10/20 0001       Shoulder Exercises: Supine   Other Supine Exercises passive Rt shoulder flexion c Lt arm assist 3 x 10    Other Supine Exercises passive ER wand in 30 deg abduction 10 sec hold x 10      Shoulder Exercises: Seated   Other Seated Exercises seated table slide flexion, scaption 5 sec hold x 10 each      Shoulder Exercises: Pulleys   Flexion 3 minutes   2-3 second hold     Manual Therapy   Manual therapy comments g3 inferior jt mobs, mobilization c movement to Rt Gh jt c ER in 45 deg abduction to tolerance, prom. easy distraction                       PT Short Term Goals - 12/10/20 1122       PT SHORT TERM GOAL #1   Title Patient will demonstrate independent use of home exercise program to maintain progress from in clinic treatments.    Time 3    Period Weeks    Status Achieved  PT Long Term Goals - 11/26/20 1125       PT LONG TERM GOAL #1   Title Patient will demonstrate/report pain at worst less than or equal to 2/10 to facilitate minimal limitation in daily activity secondary to pain symptoms.    Time 10    Period Weeks    Status New    Target Date 02/04/21      PT LONG TERM GOAL #2   Title Patient will demonstrate independent use of home exercise program to facilitate ability to maintain/progress functional gains from skilled physical therapy services.    Time 10    Period Weeks    Status New    Target Date 02/04/21      PT LONG TERM GOAL #3   Title Pt. will demonstrate FOTO outcome > or = 66% to indicated reduced disability due to condition.    Time 10    Period Weeks    Status New    Target Date 02/04/21      PT LONG TERM GOAL #4   Title Patient will demonstrate Rt GH joint mobility WFL to facilitate usual self care, dressing, reaching overhead at PLOF s limitation due to symptoms.    Time 10    Period Weeks    Status New    Target Date 02/04/21      PT LONG TERM GOAL #5    Title Patient will demonstrate Rt UE MMT 4/5 or greater throughout to facilitate usual lifting, carrying in functional activity to PLOF s limitation.    Time 10    Period Weeks    Status New    Target Date 02/04/21                   Plan - 12/10/20 1122     Clinical Impression Statement Improving capacity for passive flexion, scaption.  Abduction and ER still limited and guarded at this time but showing some progress to this point.  Continued skilled PT services indicated at this time.    Personal Factors and Comorbidities Comorbidity 1    Comorbidities HTN    Examination-Activity Limitations Caring for Others;Sleep;Bed Mobility;Carry;Dressing;Hygiene/Grooming;Lift;Reach Overhead    Examination-Participation Restrictions Cleaning;Community Activity;Driving;Interpersonal Relationship;Yard Work;Laundry;Meal Prep    Stability/Clinical Decision Making Stable/Uncomplicated    Rehab Potential Good    PT Duration Other (comment)    PT Treatment/Interventions ADLs/Self Care Home Management;Cryotherapy;Electrical Stimulation;Iontophoresis 4mg /ml Dexamethasone;Moist Heat;Balance training;Therapeutic exercise;Therapeutic activities;Functional mobility training;Ultrasound;Neuromuscular re-education;Passive range of motion;Spinal Manipulations;Joint Manipulations;Dry needling;Patient/family education;Taping;Vasopneumatic Device;Manual techniques    PT Next Visit Plan Passive mobility per protocol for motion gains.  12/30/2020 for AAROM transitioning, isometrics    PT Home Exercise Plan 03/01/2021    Consulted and Agree with Plan of Care Patient             Patient will benefit from skilled therapeutic intervention in order to improve the following deficits and impairments:  Decreased endurance, Hypomobility, Increased edema, Pain, Impaired UE functional use, Decreased strength, Decreased activity tolerance, Decreased mobility, Impaired perceived functional ability, Improper body mechanics,  Impaired flexibility, Decreased range of motion, Decreased coordination  Visit Diagnosis: Chronic right shoulder pain  Muscle weakness (generalized)  Localized edema     Problem List Patient Active Problem List   Diagnosis Date Noted   Traumatic tear of supraspinatus tendon, right, initial encounter 11/18/2020   Impingement syndrome of right shoulder 11/18/2020   Opioid use disorder, severe, dependence (HCC) 11/14/2015   MDD (major depressive disorder) 11/13/2015    11/15/2015, PT, DPT,  OCS, ATC 12/10/20  11:34 AM    [facility] Physical Therapy [address] [place], [place], [address] Phone: [phone]   Fax:  [phone]  Name: Micheal Hurley MRN: [id number] Date of Birth: 1971/02/12

## 2020-12-11 ENCOUNTER — Telehealth: Payer: Self-pay | Admitting: Orthopaedic Surgery

## 2020-12-11 NOTE — Telephone Encounter (Signed)
Pt called requesting a refill of oxycodone. Please send to pharmacy on file. Pt phone number is [phone]. Please call pt

## 2020-12-12 ENCOUNTER — Other Ambulatory Visit: Payer: Self-pay | Admitting: Podiatry

## 2020-12-14 ENCOUNTER — Other Ambulatory Visit: Payer: Self-pay | Admitting: Physician Assistant

## 2020-12-14 MED ORDER — OXYCODONE-ACETAMINOPHEN 5-325 MG PO TABS: 1.0000 | ORAL_TABLET | Freq: Three times a day (TID) | ORAL | 0 refills | Status: DC | PRN

## 2020-12-14 NOTE — Telephone Encounter (Signed)
Sent in

## 2020-12-15 ENCOUNTER — Encounter: Payer: Self-pay | Admitting: Rehabilitative and Restorative Service Providers"

## 2020-12-15 ENCOUNTER — Other Ambulatory Visit: Payer: Self-pay

## 2020-12-15 ENCOUNTER — Encounter: Payer: BC Managed Care – PPO | Admitting: Rehabilitative and Restorative Service Providers"

## 2020-12-15 ENCOUNTER — Ambulatory Visit (INDEPENDENT_AMBULATORY_CARE_PROVIDER_SITE_OTHER): Payer: BC Managed Care – PPO | Admitting: Rehabilitative and Restorative Service Providers"

## 2020-12-15 DIAGNOSIS — R600 Localized edema: Secondary | ICD-10-CM | POA: Diagnosis not present

## 2020-12-15 DIAGNOSIS — M6281 Muscle weakness (generalized): Secondary | ICD-10-CM | POA: Diagnosis not present

## 2020-12-15 DIAGNOSIS — M25511 Pain in right shoulder: Secondary | ICD-10-CM

## 2020-12-15 DIAGNOSIS — G8929 Other chronic pain: Secondary | ICD-10-CM | POA: Diagnosis not present

## 2020-12-15 NOTE — Therapy (Addendum)
[facility] Physical Therapy [address] [place], [place], [address] Phone: [phone]   Fax:  [phone]  Physical Therapy Treatment  Patient Details  Name: Micheal Hurley MRN: [id number] Date of Birth: 19-Oct-1970 Referring Provider (PT): Cristie Hem, [place]   Encounter Date: 12/15/2020   PT End of Session - 12/15/20 1018     Visit Number 6    Number of Visits 20    Date for PT Re-Evaluation 02/04/21    PT Start Time 1015    PT Stop Time 1054    PT Time Calculation (min) 39 min    Activity Tolerance Patient tolerated treatment well    Behavior During Therapy [facility] for tasks assessed/performed             Past Medical History:  Diagnosis Date   Anxiety    Depression    GERD (gastroesophageal reflux disease)    Hypertension    Substance abuse (HCC)    in remission since 2017    Past Surgical History:  Procedure Laterality Date   NO PAST SURGERIES     SHOULDER ARTHROSCOPY WITH ROTATOR CUFF REPAIR AND SUBACROMIAL DECOMPRESSION Right 11/18/2020   Procedure: RIGHT SHOULDER ARTHROSCOPY WITH ROTATOR CUFF REPAIR AND SUBACROMIAL DECOMPRESSION, AND EXTENSIVE DEBRIDEMENT;  Surgeon: Tarry Kos, MD;  Location: [facility];  Service: Orthopedics;  Laterality: Right;    There were no vitals filed for this visit.   Subjective Assessment - 12/15/20 1017     Subjective Pt. indicated 3-4/10 soreness in Rt shoulder today.    Limitations Lifting;House hold activities    Patient Stated Goals Reduce pain    Currently in Pain? Yes    Pain Score 3     Pain Location Shoulder    Pain Orientation Right    Pain Descriptors / Indicators Aching;Sore    Pain Type Chronic pain    Pain Onset More than a month ago    Pain Frequency Intermittent    Aggravating Factors  general soreness    Pain Relieving Factors resting                [facility] PT Assessment - 12/15/20 0001       Assessment   Medical Diagnosis Z98.890 (ICD-10-CM) - S/P  arthroscopy of right shoulder    Referring Provider (PT) Cristie Hem, PA-C    Onset Date/Surgical Date 11/18/20    Hand Dominance Right      Precautions   Precaution Comments 5 weeks post 12/23/2020 (isometrics and AAROM)                           [facility] Adult PT Treatment/Exercise - 12/15/20 0001       Shoulder Exercises: Supine   Other Supine Exercises passive Rt shoulder flexion c Lt arm assist x20    Other Supine Exercises passive ER wand in 45 deg abduction 10 sec hold x 10      Shoulder Exercises: Prone   Other Prone Exercises scap retraction hold 5 sec x 20 (strengthening against gravity)      Shoulder Exercises: Pulleys   Flexion 3 minutes    Scaption 3 minutes    Other Pulley Exercises 5 sec hold      Shoulder Exercises: Isometric Strengthening   Other Isometric Exercises painfree isometric Rt GH ext 5 sec hold x 15             Manual : g3 inferior jt mobs, mobilization  c movement to Rt Gh jt c ER in 45 deg abduction to tolerance, prom. easy distraction          PT Short Term Goals - 12/10/20 1122       PT SHORT TERM GOAL #1   Title Patient will demonstrate independent use of home exercise program to maintain progress from in clinic treatments.    Time 3    Period Weeks    Status Achieved               PT Long Term Goals - 11/26/20 1125       PT LONG TERM GOAL #1   Title Patient will demonstrate/report pain at worst less than or equal to 2/10 to facilitate minimal limitation in daily activity secondary to pain symptoms.    Time 10    Period Weeks    Status New    Target Date 02/04/21      PT LONG TERM GOAL #2   Title Patient will demonstrate independent use of home exercise program to facilitate ability to maintain/progress functional gains from skilled physical therapy services.    Time 10    Period Weeks    Status New    Target Date 02/04/21      PT LONG TERM GOAL #3   Title Pt. will demonstrate FOTO outcome >  or = 66% to indicated reduced disability due to condition.    Time 10    Period Weeks    Status New    Target Date 02/04/21      PT LONG TERM GOAL #4   Title Patient will demonstrate Rt GH joint mobility WFL to facilitate usual self care, dressing, reaching overhead at PLOF s limitation due to symptoms.    Time 10    Period Weeks    Status New    Target Date 02/04/21      PT LONG TERM GOAL #5   Title Patient will demonstrate Rt UE MMT 4/5 or greater throughout to facilitate usual lifting, carrying in functional activity to PLOF s limitation.    Time 10    Period Weeks    Status New    Target Date 02/04/21                   Plan - 12/15/20 1046     Clinical Impression Statement Continued overall focus on improving passive ROM within protocol(no AROM at this time) with continued steady progress noted.  ER most limited today c improvement noted in abduction range quality.  12/23/2020 AAROM /isometric flexion, abd, IR/ER approaching shortly. Continued skilled PT services indicated.    Personal Factors and Comorbidities Comorbidity 1    Comorbidities HTN    Examination-Activity Limitations Caring for Others;Sleep;Bed Mobility;Carry;Dressing;Hygiene/Grooming;Lift;Reach Overhead    Examination-Participation Restrictions Cleaning;Community Activity;Driving;Interpersonal Relationship;Yard Work;Laundry;Meal Prep    Stability/Clinical Decision Making Stable/Uncomplicated    Rehab Potential Good    PT Duration Other (comment)    PT Treatment/Interventions ADLs/Self Care Home Management;Cryotherapy;Electrical Stimulation;Iontophoresis 4mg /ml Dexamethasone;Moist Heat;Balance training;Therapeutic exercise;Therapeutic activities;Functional mobility training;Ultrasound;Neuromuscular re-education;Passive range of motion;Spinal Manipulations;Joint Manipulations;Dry needling;Patient/family education;Taping;Vasopneumatic Device;Manual techniques    PT Next Visit Plan Passive mobility per protocol  for motion gains.  12/23/2020 (corrected from last visit) AAROM inclusion, painfree isometric IR/ER, flexion, abd    PT Home Exercise Plan 12/25/2020    Consulted and Agree with Plan of Care Patient             Patient will benefit from skilled therapeutic intervention in order to  improve the following deficits and impairments:  Decreased endurance, Hypomobility, Increased edema, Pain, Impaired UE functional use, Decreased strength, Decreased activity tolerance, Decreased mobility, Impaired perceived functional ability, Improper body mechanics, Impaired flexibility, Decreased range of motion, Decreased coordination  Visit Diagnosis: Chronic right shoulder pain  Muscle weakness (generalized)  Localized edema     Problem List Patient Active Problem List   Diagnosis Date Noted   Traumatic tear of supraspinatus tendon, right, initial encounter 11/18/2020   Impingement syndrome of right shoulder 11/18/2020   Opioid use disorder, severe, dependence (HCC) 11/14/2015   MDD (major depressive disorder) 11/13/2015    Chyrel Masson, PT, DPT, OCS, ATC 12/15/20  10:49 AM    [facility] Physical Therapy [address] [place], [place], [address] Phone: [phone]   Fax:  [phone]  Name: Micheal Hurley MRN: [id number] Date of Birth: March 18, 1971

## 2020-12-17 ENCOUNTER — Encounter: Payer: Self-pay | Admitting: Rehabilitative and Restorative Service Providers"

## 2020-12-17 ENCOUNTER — Ambulatory Visit (INDEPENDENT_AMBULATORY_CARE_PROVIDER_SITE_OTHER): Payer: BC Managed Care – PPO | Admitting: Rehabilitative and Restorative Service Providers"

## 2020-12-17 ENCOUNTER — Other Ambulatory Visit: Payer: Self-pay

## 2020-12-17 DIAGNOSIS — M6281 Muscle weakness (generalized): Secondary | ICD-10-CM

## 2020-12-17 DIAGNOSIS — R600 Localized edema: Secondary | ICD-10-CM

## 2020-12-17 DIAGNOSIS — G8929 Other chronic pain: Secondary | ICD-10-CM

## 2020-12-17 DIAGNOSIS — M25511 Pain in right shoulder: Secondary | ICD-10-CM

## 2020-12-17 NOTE — Therapy (Signed)
[facility] Physical Therapy [address] [place], [place], [address] Phone: [phone]   Fax:  [phone]  Physical Therapy Treatment  Patient Details  Name: Micheal Hurley MRN: [id number] Date of Birth: Oct 24, 1970 Referring Provider (PT): Cristie Hem, [place]   Encounter Date: 12/17/2020   PT End of Session - 12/17/20 1031     Visit Number 7    Number of Visits 20    Date for PT Re-Evaluation 02/04/21    PT Start Time 1014    PT Stop Time 1055    PT Time Calculation (min) 41 min    Activity Tolerance Patient tolerated treatment well    Behavior During Therapy [facility] for tasks assessed/performed             Past Medical History:  Diagnosis Date   Anxiety    Depression    GERD (gastroesophageal reflux disease)    Hypertension    Substance abuse (HCC)    in remission since 2017    Past Surgical History:  Procedure Laterality Date   NO PAST SURGERIES     SHOULDER ARTHROSCOPY WITH ROTATOR CUFF REPAIR AND SUBACROMIAL DECOMPRESSION Right 11/18/2020   Procedure: RIGHT SHOULDER ARTHROSCOPY WITH ROTATOR CUFF REPAIR AND SUBACROMIAL DECOMPRESSION, AND EXTENSIVE DEBRIDEMENT;  Surgeon: Tarry Kos, MD;  Location: [facility];  Service: Orthopedics;  Laterality: Right;    There were no vitals filed for this visit.   Subjective Assessment - 12/17/20 1030     Subjective Pt. stated no complaints of pain at rest.  ER most painful at end range.    Limitations Lifting;House hold activities    Patient Stated Goals Reduce pain    Currently in Pain? No/denies    Pain Score 0-No pain    Pain Onset More than a month ago                               [facility] Adult PT Treatment/Exercise - 12/17/20 0001       Shoulder Exercises: Seated   Other Seated Exercises seated retraction scapular 5 sec hold x 10      Shoulder Exercises: Pulleys   Flexion 3 minutes    Scaption 3 minutes    Other Pulley Exercises 5 sec hold       Shoulder Exercises: Isometric Strengthening   Other Isometric Exercises painfree isometric Rt GH ext 5 sec hold x 12      Manual Therapy   Manual therapy comments g3 inferior jt mobs, mobilization c movement to Rt Gh jt c ER in 45 deg abduction to tolerance, prom. easy distraction                       PT Short Term Goals - 12/10/20 1122       PT SHORT TERM GOAL #1   Title Patient will demonstrate independent use of home exercise program to maintain progress from in clinic treatments.    Time 3    Period Weeks    Status Achieved               PT Long Term Goals - 11/26/20 1125       PT LONG TERM GOAL #1   Title Patient will demonstrate/report pain at worst less than or equal to 2/10 to facilitate minimal limitation in daily activity secondary to pain symptoms.    Time 10    Period Weeks  Status New    Target Date 02/04/21      PT LONG TERM GOAL #2   Title Patient will demonstrate independent use of home exercise program to facilitate ability to maintain/progress functional gains from skilled physical therapy services.    Time 10    Period Weeks    Status New    Target Date 02/04/21      PT LONG TERM GOAL #3   Title Pt. will demonstrate FOTO outcome > or = 66% to indicated reduced disability due to condition.    Time 10    Period Weeks    Status New    Target Date 02/04/21      PT LONG TERM GOAL #4   Title Patient will demonstrate Rt GH joint mobility WFL to facilitate usual self care, dressing, reaching overhead at PLOF s limitation due to symptoms.    Time 10    Period Weeks    Status New    Target Date 02/04/21      PT LONG TERM GOAL #5   Title Patient will demonstrate Rt UE MMT 4/5 or greater throughout to facilitate usual lifting, carrying in functional activity to PLOF s limitation.    Time 10    Period Weeks    Status New    Target Date 02/04/21                   Plan - 12/17/20 1039     Clinical Impression Statement  Additional time spent in ER mobility intervention manually to promote improved capsular mobility and myofascial stretching to facilitate progression in range.    Personal Factors and Comorbidities Comorbidity 1    Comorbidities HTN    Examination-Activity Limitations Caring for Others;Sleep;Bed Mobility;Carry;Dressing;Hygiene/Grooming;Lift;Reach Overhead    Examination-Participation Restrictions Cleaning;Community Activity;Driving;Interpersonal Relationship;Yard Work;Laundry;Meal Prep    Stability/Clinical Decision Making Stable/Uncomplicated    Rehab Potential Good    PT Duration Other (comment)    PT Treatment/Interventions ADLs/Self Care Home Management;Cryotherapy;Electrical Stimulation;Iontophoresis 4mg /ml Dexamethasone;Moist Heat;Balance training;Therapeutic exercise;Therapeutic activities;Functional mobility training;Ultrasound;Neuromuscular re-education;Passive range of motion;Spinal Manipulations;Joint Manipulations;Dry needling;Patient/family education;Taping;Vasopneumatic Device;Manual techniques    PT Next Visit Plan Passive mobility per protocol for motion gains.  12/23/2020 AAROM inclusion, painfree isometric IR/ER, flexion, abd c BFR    PT Home Exercise Plan 12/25/2020    Consulted and Agree with Plan of Care Patient             Patient will benefit from skilled therapeutic intervention in order to improve the following deficits and impairments:  Decreased endurance, Hypomobility, Increased edema, Pain, Impaired UE functional use, Decreased strength, Decreased activity tolerance, Decreased mobility, Impaired perceived functional ability, Improper body mechanics, Impaired flexibility, Decreased range of motion, Decreased coordination  Visit Diagnosis: Chronic right shoulder pain  Muscle weakness (generalized)  Localized edema     Problem List Patient Active Problem List   Diagnosis Date Noted   Traumatic tear of supraspinatus tendon, right, initial encounter 11/18/2020    Impingement syndrome of right shoulder 11/18/2020   Opioid use disorder, severe, dependence (HCC) 11/14/2015   MDD (major depressive disorder) 11/13/2015    11/15/2015, PT, DPT, OCS, ATC 12/17/20  10:58 AM    [facility] Physical Therapy [address] [place], [place], [place] Phone: [phone]   Fax:  [phone]  Name: Kelso Bibby MRN: Therese Sarah Date of Birth: April 30, 1970

## 2020-12-22 ENCOUNTER — Encounter: Payer: Self-pay | Admitting: Rehabilitative and Restorative Service Providers"

## 2020-12-22 ENCOUNTER — Ambulatory Visit (INDEPENDENT_AMBULATORY_CARE_PROVIDER_SITE_OTHER): Payer: BC Managed Care – PPO | Admitting: Rehabilitative and Restorative Service Providers"

## 2020-12-22 ENCOUNTER — Other Ambulatory Visit: Payer: Self-pay

## 2020-12-22 DIAGNOSIS — M6281 Muscle weakness (generalized): Secondary | ICD-10-CM

## 2020-12-22 DIAGNOSIS — G8929 Other chronic pain: Secondary | ICD-10-CM

## 2020-12-22 DIAGNOSIS — M25511 Pain in right shoulder: Secondary | ICD-10-CM

## 2020-12-22 DIAGNOSIS — R600 Localized edema: Secondary | ICD-10-CM

## 2020-12-22 NOTE — Therapy (Signed)
[facility] Physical Therapy [address] [place], [place], [address] Phone: [phone]   Fax:  [phone]  Physical Therapy Treatment  Patient Details  Name: Micheal Hurley MRN: [id number] Date of Birth: 10-29-1970 Referring Provider (PT): Cristie Hem, [place]   Encounter Date: 12/22/2020   PT End of Session - 12/22/20 1143     Visit Number 8    Number of Visits 20    Date for PT Re-Evaluation 02/04/21    PT Start Time 1141    PT Stop Time 1220    PT Time Calculation (min) 39 min    Activity Tolerance Patient tolerated treatment well    Behavior During Therapy [facility] for tasks assessed/performed             Past Medical History:  Diagnosis Date   Anxiety    Depression    GERD (gastroesophageal reflux disease)    Hypertension    Substance abuse (HCC)    in remission since 2017    Past Surgical History:  Procedure Laterality Date   NO PAST SURGERIES     SHOULDER ARTHROSCOPY WITH ROTATOR CUFF REPAIR AND SUBACROMIAL DECOMPRESSION Right 11/18/2020   Procedure: RIGHT SHOULDER ARTHROSCOPY WITH ROTATOR CUFF REPAIR AND SUBACROMIAL DECOMPRESSION, AND EXTENSIVE DEBRIDEMENT;  Surgeon: Tarry Kos, MD;  Location: [facility];  Service: Orthopedics;  Laterality: Right;    There were no vitals filed for this visit.   Subjective Assessment - 12/22/20 1142     Subjective Pt. indicated no pain today upon arrival.  Pt. stated he had some pain yesterday after traveling.    Limitations Lifting;House hold activities    Patient Stated Goals Reduce pain    Currently in Pain? No/denies    Pain Score 0-No pain    Pain Onset More than a month ago                [facility] PT Assessment - 12/22/20 0001       Assessment   Medical Diagnosis Z98.890 (ICD-10-CM) - S/P arthroscopy of right shoulder    Referring Provider (PT) Cristie Hem, PA-C    Onset Date/Surgical Date 11/18/20    Hand Dominance Right                            [facility] Adult PT Treatment/Exercise - 12/22/20 0001       Shoulder Exercises: Supine   Flexion AAROM   3 x 10 c 1 lb bar   Other Supine Exercises supine wand abduction 5 sec hold x 10 2 sets    Other Supine Exercises passive ER wand in 45 deg abduction 10 sec hold x 10      Shoulder Exercises: Pulleys   Flexion 3 minutes    Scaption 3 minutes    Other Pulley Exercises 5 sec hold      Shoulder Exercises: Isometric Strengthening   Other Isometric Exercises painfree isometric flexion, abd hold Rt 5 sec hold x 10      Manual Therapy   Manual therapy comments g3 inferior jt mobs, mobilization c movement to Rt Gh jt c ER in 45 deg abduction to tolerance, prom.                       PT Short Term Goals - 12/10/20 1122       PT SHORT TERM GOAL #1   Title Patient will demonstrate independent use of home exercise  program to maintain progress from in clinic treatments.    Time 3    Period Weeks    Status Achieved               PT Long Term Goals - 11/26/20 1125       PT LONG TERM GOAL #1   Title Patient will demonstrate/report pain at worst less than or equal to 2/10 to facilitate minimal limitation in daily activity secondary to pain symptoms.    Time 10    Period Weeks    Status New    Target Date 02/04/21      PT LONG TERM GOAL #2   Title Patient will demonstrate independent use of home exercise program to facilitate ability to maintain/progress functional gains from skilled physical therapy services.    Time 10    Period Weeks    Status New    Target Date 02/04/21      PT LONG TERM GOAL #3   Title Pt. will demonstrate FOTO outcome > or = 66% to indicated reduced disability due to condition.    Time 10    Period Weeks    Status New    Target Date 02/04/21      PT LONG TERM GOAL #4   Title Patient will demonstrate Rt GH joint mobility WFL to facilitate usual self care, dressing, reaching overhead at PLOF s limitation due  to symptoms.    Time 10    Period Weeks    Status New    Target Date 02/04/21      PT LONG TERM GOAL #5   Title Patient will demonstrate Rt UE MMT 4/5 or greater throughout to facilitate usual lifting, carrying in functional activity to PLOF s limitation.    Time 10    Period Weeks    Status New    Target Date 02/04/21                   Plan - 12/22/20 1156     Clinical Impression Statement Early attempts today at [facility] in flexion in supine performed farily well overall c occasional discomfort noted.  Will plan to include into HEP next visit.  Continued skilled PT services indicated at this time to progress.    Personal Factors and Comorbidities Comorbidity 1    Comorbidities HTN    Examination-Activity Limitations Caring for Others;Sleep;Bed Mobility;Carry;Dressing;Hygiene/Grooming;Lift;Reach Overhead    Examination-Participation Restrictions Cleaning;Community Activity;Driving;Interpersonal Relationship;Yard Work;Laundry;Meal Prep    Stability/Clinical Decision Making Stable/Uncomplicated    Rehab Potential Good    PT Duration Other (comment)    PT Treatment/Interventions ADLs/Self Care Home Management;Cryotherapy;Electrical Stimulation;Iontophoresis 4mg /ml Dexamethasone;Moist Heat;Balance training;Therapeutic exercise;Therapeutic activities;Functional mobility training;Ultrasound;Neuromuscular re-education;Passive range of motion;Spinal Manipulations;Joint Manipulations;Dry needling;Patient/family education;Taping;Vasopneumatic Device;Manual techniques    PT Next Visit Plan Inclusion of AAROM (add to HEP), isometrics all directions.  BFR use in AAROM if tolerated.    PT Home Exercise Plan    Consulted and Agree with Plan of Care Patient             Patient will benefit from skilled therapeutic intervention in order to improve the following deficits and impairments:  Decreased endurance, Hypomobility, Increased edema, Pain, Impaired UE functional use, Decreased  strength, Decreased activity tolerance, Decreased mobility, Impaired perceived functional ability, Improper body mechanics, Impaired flexibility, Decreased range of motion, Decreased coordination  Visit Diagnosis: Chronic right shoulder pain  Muscle weakness (generalized)  Localized edema     Problem List Patient Active Problem List   Diagnosis  Date Noted   Traumatic tear of supraspinatus tendon, right, initial encounter 11/18/2020   Impingement syndrome of right shoulder 11/18/2020   Opioid use disorder, severe, dependence (HCC) 11/14/2015   MDD (major depressive disorder) 11/13/2015   Chyrel Masson, PT, DPT, OCS, ATC 12/22/20  12:28 PM    [facility] Physical Therapy [address] [place], [place], [address] Phone: [phone]   Fax:  [phone]  Name: Trestan Vahle MRN: [id number] Date of Birth: 12-15-1970

## 2020-12-24 ENCOUNTER — Ambulatory Visit (INDEPENDENT_AMBULATORY_CARE_PROVIDER_SITE_OTHER): Payer: BC Managed Care – PPO | Admitting: Rehabilitative and Restorative Service Providers"

## 2020-12-24 ENCOUNTER — Telehealth: Payer: Self-pay | Admitting: Orthopaedic Surgery

## 2020-12-24 ENCOUNTER — Encounter: Payer: Self-pay | Admitting: Rehabilitative and Restorative Service Providers"

## 2020-12-24 ENCOUNTER — Other Ambulatory Visit: Payer: Self-pay

## 2020-12-24 DIAGNOSIS — M6281 Muscle weakness (generalized): Secondary | ICD-10-CM

## 2020-12-24 DIAGNOSIS — M25511 Pain in right shoulder: Secondary | ICD-10-CM | POA: Diagnosis not present

## 2020-12-24 DIAGNOSIS — R600 Localized edema: Secondary | ICD-10-CM | POA: Diagnosis not present

## 2020-12-24 DIAGNOSIS — G8929 Other chronic pain: Secondary | ICD-10-CM | POA: Diagnosis not present

## 2020-12-24 NOTE — Therapy (Signed)
[facility] Physical Therapy [address] [place], [place], [address] Phone: [phone]   Fax:  [phone]  Physical Therapy Treatment  Patient Details  Name: Micheal Hurley MRN: [id number] Date of Birth: 30-Jul-1970 Referring Provider (PT): Cristie Hem, [place]   Encounter Date: 12/24/2020   PT End of Session - 12/24/20 1109     Visit Number 9    Number of Visits 20    Date for PT Re-Evaluation 02/04/21    PT Start Time 1101    PT Stop Time 1141    PT Time Calculation (min) 40 min    Activity Tolerance Patient tolerated treatment well    Behavior During Therapy [facility] for tasks assessed/performed             Past Medical History:  Diagnosis Date   Anxiety    Depression    GERD (gastroesophageal reflux disease)    Hypertension    Substance abuse (HCC)    in remission since 2017    Past Surgical History:  Procedure Laterality Date   NO PAST SURGERIES     SHOULDER ARTHROSCOPY WITH ROTATOR CUFF REPAIR AND SUBACROMIAL DECOMPRESSION Right 11/18/2020   Procedure: RIGHT SHOULDER ARTHROSCOPY WITH ROTATOR CUFF REPAIR AND SUBACROMIAL DECOMPRESSION, AND EXTENSIVE DEBRIDEMENT;  Surgeon: Tarry Kos, MD;  Location: [facility];  Service: Orthopedics;  Laterality: Right;    There were no vitals filed for this visit.   Subjective Assessment - 12/24/20 1106     Subjective Pt. stated since trip last weekend he has felt off and on return of throbbing up to 6/10 at times.  Pt. stated no worse in last few days.    Limitations Lifting;House hold activities    Patient Stated Goals Reduce pain    Currently in Pain? Yes    Pain Score 6     Pain Location Shoulder    Pain Orientation Right    Pain Descriptors / Indicators Throbbing;Sore    Pain Type Chronic pain    Pain Onset More than a month ago    Pain Frequency Intermittent    Aggravating Factors  throbbing at rest, some worse at times with movement at times    Pain Relieving Factors unsure                                [facility] Adult PT Treatment/Exercise - 12/24/20 0001       Shoulder Exercises: Supine   Flexion AAROM;Both   3 x 10 1 lb bar, Lt arm assisting x 10 into flexion   Other Supine Exercises scapular retraction 5 sec hold x 10 into table      Shoulder Exercises: Pulleys   Flexion 3 minutes    Scaption 3 minutes    Other Pulley Exercises 5 sec hold      Shoulder Exercises: Isometric Strengthening   Other Isometric Exercises painfree isometric flexion, abd hold Rt 5 sec hold x 10      Manual Therapy   Manual therapy comments g3 inferior jt mobs, mobilization c movement to Rt Gh jt c ER in 45 deg abduction to tolerance, compression to Rt infraspinatus in sidelying c passive ER.   Contract/relax to subscap and lat for mobility gains                       PT Short Term Goals - 12/10/20 1122       PT  SHORT TERM GOAL #1   Title Patient will demonstrate independent use of home exercise program to maintain progress from in clinic treatments.    Time 3    Period Weeks    Status Achieved               PT Long Term Goals - 12/24/20 1139       PT LONG TERM GOAL #1   Title Patient will demonstrate/report pain at worst less than or equal to 2/10 to facilitate minimal limitation in daily activity secondary to pain symptoms.    Time 10    Period Weeks    Status On-going    Target Date 02/04/21      PT LONG TERM GOAL #2   Title Patient will demonstrate independent use of home exercise program to facilitate ability to maintain/progress functional gains from skilled physical therapy services.    Time 10    Period Weeks    Status On-going    Target Date 02/04/21      PT LONG TERM GOAL #3   Title Pt. will demonstrate FOTO outcome > or = 66% to indicated reduced disability due to condition.    Time 10    Period Weeks    Status On-going    Target Date 02/04/21      PT LONG TERM GOAL #4   Title Patient will demonstrate Rt  GH joint mobility WFL to facilitate usual self care, dressing, reaching overhead at PLOF s limitation due to symptoms.    Time 10    Period Weeks    Status On-going    Target Date 02/04/21      PT LONG TERM GOAL #5   Title Patient will demonstrate Rt UE MMT 4/5 or greater throughout to facilitate usual lifting, carrying in functional activity to PLOF s limitation.    Time 10    Period Weeks    Status On-going    Target Date 02/04/21                   Plan - 12/24/20 1137     Clinical Impression Statement Throbbing complaints reported since travel last weekend were noted.  Improved symptoms noted after manual trigger point release techniques in infraspinatus as well as manual joint mobility intervention.  Pt. improved on AAROM wand flexion exercise (added to home use).   Continued skilled PT indicated at this time.    Personal Factors and Comorbidities Comorbidity 1    Comorbidities HTN    Examination-Activity Limitations Caring for Others;Sleep;Bed Mobility;Carry;Dressing;Hygiene/Grooming;Lift;Reach Overhead    Examination-Participation Restrictions Cleaning;Community Activity;Driving;Interpersonal Relationship;Yard Work;Laundry;Meal Prep    Stability/Clinical Decision Making Stable/Uncomplicated    Rehab Potential Good    PT Duration Other (comment)    PT Treatment/Interventions ADLs/Self Care Home Management;Cryotherapy;Electrical Stimulation;Iontophoresis 4mg /ml Dexamethasone;Moist Heat;Balance training;Therapeutic exercise;Therapeutic activities;Functional mobility training;Ultrasound;Neuromuscular re-education;Passive range of motion;Spinal Manipulations;Joint Manipulations;Dry needling;Patient/family education;Taping;Vasopneumatic Device;Manual techniques    PT Next Visit Plan Inclusion of AAROM (add to HEP), isometrics all directions.  BFR use in AAROM if tolerated (still holding due to throbbing complaints)  MD progress note    PT Home Exercise Plan [facility]    Consulted  and Agree with Plan of Care Patient             Patient will benefit from skilled therapeutic intervention in order to improve the following deficits and impairments:  Decreased endurance, Hypomobility, Increased edema, Pain, Impaired UE functional use, Decreased strength, Decreased activity tolerance, Decreased mobility, Impaired perceived functional ability, Improper  body mechanics, Impaired flexibility, Decreased range of motion, Decreased coordination  Visit Diagnosis: Chronic right shoulder pain  Muscle weakness (generalized)  Localized edema     Problem List Patient Active Problem List   Diagnosis Date Noted   Traumatic tear of supraspinatus tendon, right, initial encounter 11/18/2020   Impingement syndrome of right shoulder 11/18/2020   Opioid use disorder, severe, dependence (HCC) 11/14/2015   MDD (major depressive disorder) 11/13/2015   Chyrel Masson, PT, DPT, OCS, ATC 12/24/20  11:40 AM    [facility] Physical Therapy [address] [place], [place], [address] Phone: [phone]   Fax:  [phone]  Name: Sumeet Geter MRN: [id number] Date of Birth: 07/17/1970

## 2020-12-24 NOTE — Telephone Encounter (Signed)
Patient is here for PT. He would like a refill on oxycodone. His call back number is [phone]

## 2020-12-25 ENCOUNTER — Other Ambulatory Visit: Payer: Self-pay | Admitting: Physician Assistant

## 2020-12-25 MED ORDER — HYDROCODONE-ACETAMINOPHEN 5-325 MG PO TABS: 1.0000 | ORAL_TABLET | Freq: Three times a day (TID) | ORAL | 0 refills | Status: DC | PRN

## 2020-12-25 NOTE — Telephone Encounter (Signed)
Weaning to norco and sent in

## 2020-12-29 ENCOUNTER — Other Ambulatory Visit: Payer: Self-pay

## 2020-12-29 ENCOUNTER — Ambulatory Visit (INDEPENDENT_AMBULATORY_CARE_PROVIDER_SITE_OTHER): Payer: BC Managed Care – PPO | Admitting: Orthopaedic Surgery

## 2020-12-29 ENCOUNTER — Encounter: Payer: Self-pay | Admitting: Rehabilitative and Restorative Service Providers"

## 2020-12-29 ENCOUNTER — Encounter: Payer: Self-pay | Admitting: Orthopaedic Surgery

## 2020-12-29 ENCOUNTER — Ambulatory Visit (INDEPENDENT_AMBULATORY_CARE_PROVIDER_SITE_OTHER): Payer: BC Managed Care – PPO | Admitting: Rehabilitative and Restorative Service Providers"

## 2020-12-29 DIAGNOSIS — R600 Localized edema: Secondary | ICD-10-CM

## 2020-12-29 DIAGNOSIS — G8929 Other chronic pain: Secondary | ICD-10-CM | POA: Diagnosis not present

## 2020-12-29 DIAGNOSIS — M6281 Muscle weakness (generalized): Secondary | ICD-10-CM

## 2020-12-29 DIAGNOSIS — M25511 Pain in right shoulder: Secondary | ICD-10-CM | POA: Diagnosis not present

## 2020-12-29 DIAGNOSIS — S46811A Strain of other muscles, fascia and tendons at shoulder and upper arm level, right arm, initial encounter: Secondary | ICD-10-CM

## 2020-12-29 DIAGNOSIS — Z9889 Other specified postprocedural states: Secondary | ICD-10-CM

## 2020-12-29 NOTE — Progress Notes (Signed)
   Post-Op Visit Note   Patient: Micheal Hurley           Date of Birth: 09-Jan-1971           MRN: [id number] Visit Date: 12/29/2020 PCP: Pcp, No   Assessment & Plan:  Chief Complaint:  Chief Complaint  Patient presents with   Right Shoulder - Pain   Visit Diagnoses:  1. S/P arthroscopy of right shoulder   2. Traumatic tear of supraspinatus tendon of right shoulder, initial encounter     Plan: Micheal Hurley is 6 weeks status post rotator cuff repair date of surgery 11/18/2020.  He has some pain at times but not at rest.  His range of motion is improving.  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  Overall doing well.  Takes hydrocodone very occasionally.  Surgical scars are all fully healed.  Range of motion is flexion to 135 abduction to 130.  External rotation to 45 degrees.  He has no significant pain with passive range of motion.  At this point he can begin strengthening her protocol.  Discontinue the sling.  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  Recheck at that time.  Follow-Up Instructions: No follow-ups on file.   Orders:  No orders of the defined types were placed in this encounter.  No orders of the defined types were placed in this encounter.   Imaging: No results found.  PMFS History: Patient Active Problem List   Diagnosis Date Noted   Traumatic tear of supraspinatus tendon, right, initial encounter 11/18/2020   Impingement syndrome of right shoulder 11/18/2020   Opioid use disorder, severe, dependence (HCC) 11/14/2015   MDD (major depressive disorder) 11/13/2015   Past Medical History:  Diagnosis Date   Anxiety    Depression    GERD (gastroesophageal reflux disease)    Hypertension    Substance abuse (HCC)    in remission since 2017    Family History  Problem Relation Age of Onset   Mental illness Neg Hx     Past Surgical History:  Procedure  Laterality Date   NO PAST SURGERIES     SHOULDER ARTHROSCOPY WITH ROTATOR CUFF REPAIR AND SUBACROMIAL DECOMPRESSION Right 11/18/2020   Procedure: RIGHT SHOULDER ARTHROSCOPY WITH ROTATOR CUFF REPAIR AND SUBACROMIAL DECOMPRESSION, AND EXTENSIVE DEBRIDEMENT;  Surgeon: Tarry Kos, MD;  Location: [facility];  Service: Orthopedics;  Laterality: Right;   Social History   Occupational History   Not on file  Tobacco Use   Smoking status: Former    Packs/day: 0.50    Years: 20.00    Pack years: 10.00    Types: Cigarettes    Quit date: 07/30/2016    Years since quitting: 4.4   Smokeless tobacco: Never  Vaping Use   Vaping Use: Never used  Substance and Sexual Activity   Alcohol use: No    Comment: history   Drug use: Yes    Comment: opiates - quit  August 2017   Sexual activity: Not on file

## 2020-12-29 NOTE — Therapy (Signed)
[facility] Physical Therapy [address] [place], [place], [address] Phone: [phone]   Fax:  [phone]  Physical Therapy Treatment/Progress Note  Patient Details  Name: Micheal Hurley MRN: [id number] Date of Birth: April 03, 1970 Referring Provider (PT): Cristie Hem, [place]   Encounter Date: 12/29/2020  Progress Note Reporting Period 11/26/2020 to 12/29/2020  See note below for Objective Data and Assessment of Progress/Goals.       PT End of Session - 12/29/20 0854     Visit Number 10    Number of Visits 20    Date for PT Re-Evaluation 02/04/21    Progress Note Due on Visit 20    PT Start Time 0842    PT Stop Time 0922    PT Time Calculation (min) 40 min    Activity Tolerance Patient tolerated treatment well    Behavior During Therapy [facility] for tasks assessed/performed             Past Medical History:  Diagnosis Date   Anxiety    Depression    GERD (gastroesophageal reflux disease)    Hypertension    Substance abuse (HCC)    in remission since 2017    Past Surgical History:  Procedure Laterality Date   NO PAST SURGERIES     SHOULDER ARTHROSCOPY WITH ROTATOR CUFF REPAIR AND SUBACROMIAL DECOMPRESSION Right 11/18/2020   Procedure: RIGHT SHOULDER ARTHROSCOPY WITH ROTATOR CUFF REPAIR AND SUBACROMIAL DECOMPRESSION, AND EXTENSIVE DEBRIDEMENT;  Surgeon: Tarry Kos, MD;  Location: [facility];  Service: Orthopedics;  Laterality: Right;    There were no vitals filed for this visit.   Subjective Assessment - 12/29/20 0853     Subjective Pt. reported he thought the pressure on back of shoulder trigger points helped the pain some.  No pain at rest but has had the throbbing at times.    Limitations Lifting;House hold activities    Patient Stated Goals Reduce pain    Currently in Pain? No/denies    Pain Score 0-No pain    Pain Onset More than a month ago                [facility] PT Assessment - 12/29/20 0001        Assessment   Medical Diagnosis Z98.890 (ICD-10-CM) - S/P arthroscopy of right shoulder    Referring Provider (PT) Cristie Hem, PA-C    Onset Date/Surgical Date 11/18/20    Hand Dominance Right      AROM   Right Shoulder Flexion 135 Degrees   in supine     PROM   Right Shoulder Flexion 135 Degrees   in supine   Right Shoulder ABduction 130 Degrees   in supine   Right Shoulder Internal Rotation 50 Degrees   in supine 45 deg abduction   Right Shoulder External Rotation 45 Degrees   in supine 45 deg abduction     Strength   Overall Strength Comments No strength testing per protocol                           [facility] Adult PT Treatment/Exercise - 12/29/20 0001       Neuro Re-ed    Neuro Re-ed Details  stabilization holds mild resistance 15 second bouts in 90 deg flexion supine      Shoulder Exercises: Supine   Flexion AROM;Both;20 reps   to Rt fatigue :   Other Supine Exercises passive flexion c Lt arm help x  10, wand 1 lb flexion AAROM x 15      Shoulder Exercises: Sidelying   ABduction Right   2x (poor eccentric control)     Shoulder Exercises: Pulleys   Flexion 2 minutes    Scaption 2 minutes    Other Pulley Exercises 5 sec hold      Shoulder Exercises: ROM/Strengthening   UBE (Upper Arm Bike) Lvl 2.0 4 mins fwd/back each way      Manual Therapy   Manual therapy comments contract/relax for ER/IR in 45 deg abduction supine                       PT Short Term Goals - 12/10/20 1122       PT SHORT TERM GOAL #1   Title Patient will demonstrate independent use of home exercise program to maintain progress from in clinic treatments.    Time 3    Period Weeks    Status Achieved               PT Long Term Goals - 12/29/20 1517       PT LONG TERM GOAL #1   Title Patient will demonstrate/report pain at worst less than or equal to 2/10 to facilitate minimal limitation in daily activity secondary to pain symptoms.    Time 10     Period Weeks    Status On-going    Target Date 02/04/21      PT LONG TERM GOAL #2   Title Patient will demonstrate independent use of home exercise program to facilitate ability to maintain/progress functional gains from skilled physical therapy services.    Time 10    Period Weeks    Status On-going    Target Date 02/04/21      PT LONG TERM GOAL #3   Title Pt. will demonstrate FOTO outcome > or = 66% to indicated reduced disability due to condition.    Time 10    Period Weeks    Status On-going    Target Date 02/04/21      PT LONG TERM GOAL #4   Title Patient will demonstrate Rt GH joint mobility WFL to facilitate usual self care, dressing, reaching overhead at PLOF s limitation due to symptoms.    Time 10    Period Weeks    Status On-going    Target Date 02/04/21      PT LONG TERM GOAL #5   Title Patient will demonstrate Rt UE MMT 4/5 or greater throughout to facilitate usual lifting, carrying in functional activity to PLOF s limitation.    Time 10    Period Weeks    Status On-going    Target Date 02/04/21                   Plan - 12/29/20 0917     Clinical Impression Statement Pt. has attended 10 visits overall during course of treatment.  See objective data for updated information.  Pt. has demonstrated gains in passive Rt shoulder mobility and recently started initiation of AAROM, early AROM intervention to promote improved muscular recruitment and strength for functional use going forward.  Continued skilled PT services indicated at this time.    Personal Factors and Comorbidities Comorbidity 1    Comorbidities HTN    Examination-Activity Limitations Caring for Others;Sleep;Bed Mobility;Carry;Dressing;Hygiene/Grooming;Lift;Reach Overhead    Examination-Participation Restrictions Cleaning;Community Activity;Driving;Interpersonal Relationship;Yard Work;Laundry;Meal Prep    Stability/Clinical Decision Making Stable/Uncomplicated    Rehab Potential Good  PT  Duration Other (comment)    PT Treatment/Interventions ADLs/Self Care Home Management;Cryotherapy;Electrical Stimulation;Iontophoresis 4mg /ml Dexamethasone;Moist Heat;Balance training;Therapeutic exercise;Therapeutic activities;Functional mobility training;Ultrasound;Neuromuscular re-education;Passive range of motion;Spinal Manipulations;Joint Manipulations;Dry needling;Patient/family education;Taping;Vasopneumatic Device;Manual techniques    PT Next Visit Plan AROM in supine/sidelying c possible BFR once able to perform    PT Home Exercise Plan [facility]    Consulted and Agree with Plan of Care Patient             Patient will benefit from skilled therapeutic intervention in order to improve the following deficits and impairments:  Decreased endurance, Hypomobility, Increased edema, Pain, Impaired UE functional use, Decreased strength, Decreased activity tolerance, Decreased mobility, Impaired perceived functional ability, Improper body mechanics, Impaired flexibility, Decreased range of motion, Decreased coordination  Visit Diagnosis: Chronic right shoulder pain  Muscle weakness (generalized)  Localized edema     Problem List Patient Active Problem List   Diagnosis Date Noted   Traumatic tear of supraspinatus tendon, right, initial encounter 11/18/2020   Impingement syndrome of right shoulder 11/18/2020   Opioid use disorder, severe, dependence (HCC) 11/14/2015   MDD (major depressive disorder) 11/13/2015   11/15/2015, PT, DPT, OCS, ATC 12/29/20  9:23 AM   [facility] Physical Therapy [address] [place], [place], [place] Phone: [phone]   Fax:  [phone]  Name: Micheal Hurley MRN: Therese Sarah Date of Birth: [birth year]

## 2020-12-31 ENCOUNTER — Other Ambulatory Visit: Payer: Self-pay

## 2020-12-31 ENCOUNTER — Encounter: Payer: Self-pay | Admitting: Rehabilitative and Restorative Service Providers"

## 2020-12-31 ENCOUNTER — Ambulatory Visit (INDEPENDENT_AMBULATORY_CARE_PROVIDER_SITE_OTHER): Payer: BC Managed Care – PPO | Admitting: Rehabilitative and Restorative Service Providers"

## 2020-12-31 DIAGNOSIS — M6281 Muscle weakness (generalized): Secondary | ICD-10-CM | POA: Diagnosis not present

## 2020-12-31 DIAGNOSIS — M25511 Pain in right shoulder: Secondary | ICD-10-CM | POA: Diagnosis not present

## 2020-12-31 DIAGNOSIS — R600 Localized edema: Secondary | ICD-10-CM

## 2020-12-31 DIAGNOSIS — G8929 Other chronic pain: Secondary | ICD-10-CM | POA: Diagnosis not present

## 2020-12-31 NOTE — Therapy (Signed)
[facility] [address] [place], [place], [address] Phone: [phone]   Fax:  [phone]  Physical Therapy Treatment  Patient Details  Name: Micheal Hurley MRN: [id number] Date of Birth: 1970-05-13 Referring Provider (PT): Cristie Hem, [place]   Encounter Date: 12/31/2020   PT End of Session - 12/31/20 0931     Visit Number 11    Number of Visits 20    Date for PT Re-Evaluation 02/04/21    Progress Note Due on Visit 20    PT Start Time 0927    PT Stop Time 0955   had to leave early per Pt. request   PT Time Calculation (min) 28 min    Activity Tolerance Patient tolerated treatment well    Behavior During Therapy [facility] for tasks assessed/performed             Past Medical History:  Diagnosis Date   Anxiety    Depression    GERD (gastroesophageal reflux disease)    Hypertension    Substance abuse (HCC)    in remission since 2017    Past Surgical History:  Procedure Laterality Date   NO PAST SURGERIES     SHOULDER ARTHROSCOPY WITH ROTATOR CUFF REPAIR AND SUBACROMIAL DECOMPRESSION Right 11/18/2020   Procedure: RIGHT SHOULDER ARTHROSCOPY WITH ROTATOR CUFF REPAIR AND SUBACROMIAL DECOMPRESSION, AND EXTENSIVE DEBRIDEMENT;  Surgeon: Tarry Kos, MD;  Location: [facility];  Service: Orthopedics;  Laterality: Right;    There were no vitals filed for this visit.   Subjective Assessment - 12/31/20 0930     Subjective Pt. indicated he woke up stiff and sore today after sleeping without sling.  MD visit released him from sling, out of work for 6 more weeks.    Limitations Lifting;House hold activities    Patient Stated Goals Reduce pain    Currently in Pain? Yes    Pain Score 6     Pain Location Shoulder    Pain Orientation Right    Pain Descriptors / Indicators Tightness;Throbbing;Sore    Pain Type Chronic pain    Pain Onset More than a month ago    Pain Frequency Intermittent    Aggravating Factors  sleeping  without sling    Pain Relieving Factors felt good from exercises the last few days                               [facility] Adult PT Treatment/Exercise - 12/31/20 0001       Shoulder Exercises: Supine   Flexion AROM;Both   1 lb x 30   Shoulder Flexion Weight (lbs) 1      Shoulder Exercises: Sidelying   External Rotation AROM;Right   c towel at side, 3 x 10   External Rotation Weight (lbs) 1    ABduction Right   3 x 5     Shoulder Exercises: Standing   Row 20 reps;Both   green   Theraband Level (Shoulder Row) Level 3 (Green)      Shoulder Exercises: Pulleys   Flexion 2 minutes    Scaption 2 minutes    Other Pulley Exercises 5 sec hold      Shoulder Exercises: ROM/Strengthening   UBE (Upper Arm Bike) Lvl 3.0 2 mins fwd/back each way                       PT Short Term Goals - 12/10/20 1122  PT SHORT TERM GOAL #1   Title Patient will demonstrate independent use of home exercise program to maintain progress from in clinic treatments.    Time 3    Period Weeks    Status Achieved               PT Long Term Goals - 12/29/20 3664       PT LONG TERM GOAL #1   Title Patient will demonstrate/report pain at worst less than or equal to 2/10 to facilitate minimal limitation in daily activity secondary to pain symptoms.    Time 10    Period Weeks    Status On-going    Target Date 02/04/21      PT LONG TERM GOAL #2   Title Patient will demonstrate independent use of home exercise program to facilitate ability to maintain/progress functional gains from skilled physical therapy services.    Time 10    Period Weeks    Status On-going    Target Date 02/04/21      PT LONG TERM GOAL #3   Title Pt. will demonstrate FOTO outcome > or = 66% to indicated reduced disability due to condition.    Time 10    Period Weeks    Status On-going    Target Date 02/04/21      PT LONG TERM GOAL #4   Title Patient will demonstrate Rt GH joint mobility  WFL to facilitate usual self care, dressing, reaching overhead at PLOF s limitation due to symptoms.    Time 10    Period Weeks    Status On-going    Target Date 02/04/21      PT LONG TERM GOAL #5   Title Patient will demonstrate Rt UE MMT 4/5 or greater throughout to facilitate usual lifting, carrying in functional activity to PLOF s limitation.    Time 10    Period Weeks    Status On-going    Target Date 02/04/21                   Plan - 12/31/20 0935     Clinical Impression Statement Treatment shortened today due to Pt. having to leave early (limited progression of any new interventions as MD note released for active movement/strengthening progression).  Current presentation indicated limitations in progression of strengthening from ability more than any protocol restriction.    Personal Factors and Comorbidities Comorbidity 1    Comorbidities HTN    Examination-Activity Limitations Caring for Others;Sleep;Bed Mobility;Carry;Dressing;Hygiene/Grooming;Lift;Reach Overhead    Examination-Participation Restrictions Cleaning;Community Activity;Driving;Interpersonal Relationship;Yard Work;Laundry;Meal Prep    Stability/Clinical Decision Making Stable/Uncomplicated    Rehab Potential Good    PT Duration Other (comment)    PT Treatment/Interventions ADLs/Self Care Home Management;Cryotherapy;Electrical Stimulation;Iontophoresis 4mg /ml Dexamethasone;Moist Heat;Balance training;Therapeutic exercise;Therapeutic activities;Functional mobility training;Ultrasound;Neuromuscular re-education;Passive range of motion;Spinal Manipulations;Joint Manipulations;Dry needling;Patient/family education;Taping;Vasopneumatic Device;Manual techniques    PT Next Visit Plan BFR in supine ,sidelying active movement strengthening    PT Home Exercise Plan    Consulted and Agree with Plan of Care Patient             Patient will benefit from skilled therapeutic intervention in order to improve  the following deficits and impairments:  Decreased endurance, Hypomobility, Increased edema, Pain, Impaired UE functional use, Decreased strength, Decreased activity tolerance, Decreased mobility, Impaired perceived functional ability, Improper body mechanics, Impaired flexibility, Decreased range of motion, Decreased coordination  Visit Diagnosis: Chronic right shoulder pain  Muscle weakness (generalized)  Localized edema  Problem List Patient Active Problem List   Diagnosis Date Noted   Traumatic tear of supraspinatus tendon, right, initial encounter 11/18/2020   Impingement syndrome of right shoulder 11/18/2020   Opioid use disorder, severe, dependence (HCC) 11/14/2015   MDD (major depressive disorder) 11/13/2015   Chyrel Masson, PT, DPT, OCS, ATC 12/31/20  9:53 AM    [facility] [address] [place], [place], [address] Phone: [phone]   Fax:  [phone]  Name: Micheal Hurley MRN: [id number] Date of Birth: 1971-02-09

## 2021-01-05 ENCOUNTER — Other Ambulatory Visit: Payer: Self-pay

## 2021-01-05 ENCOUNTER — Ambulatory Visit (INDEPENDENT_AMBULATORY_CARE_PROVIDER_SITE_OTHER): Payer: BC Managed Care – PPO | Admitting: Rehabilitative and Restorative Service Providers"

## 2021-01-05 ENCOUNTER — Encounter: Payer: Self-pay | Admitting: Rehabilitative and Restorative Service Providers"

## 2021-01-05 DIAGNOSIS — R600 Localized edema: Secondary | ICD-10-CM

## 2021-01-05 DIAGNOSIS — M25511 Pain in right shoulder: Secondary | ICD-10-CM | POA: Diagnosis not present

## 2021-01-05 DIAGNOSIS — G8929 Other chronic pain: Secondary | ICD-10-CM

## 2021-01-05 DIAGNOSIS — M6281 Muscle weakness (generalized): Secondary | ICD-10-CM | POA: Diagnosis not present

## 2021-01-05 NOTE — Therapy (Signed)
[facility] [address] [place], [place], [address] Phone: [phone]   Fax:  [phone]  Physical Therapy Treatment  Patient Details  Name: Micheal Hurley MRN: [id number] Date of Birth: April 26, 1970 Referring Provider (PT): Cristie Hem, [place]   Encounter Date: 01/05/2021   PT End of Session - 01/05/21 1026     Visit Number 12    Number of Visits 20    Date for PT Re-Evaluation 02/04/21    Progress Note Due on Visit 20    PT Start Time 1055    PT Stop Time 1145    PT Time Calculation (min) 50 min    Activity Tolerance Patient tolerated treatment well    Behavior During Therapy [facility] for tasks assessed/performed             Past Medical History:  Diagnosis Date   Anxiety    Depression    GERD (gastroesophageal reflux disease)    Hypertension    Substance abuse (HCC)    in remission since 2017    Past Surgical History:  Procedure Laterality Date   NO PAST SURGERIES     SHOULDER ARTHROSCOPY WITH ROTATOR CUFF REPAIR AND SUBACROMIAL DECOMPRESSION Right 11/18/2020   Procedure: RIGHT SHOULDER ARTHROSCOPY WITH ROTATOR CUFF REPAIR AND SUBACROMIAL DECOMPRESSION, AND EXTENSIVE DEBRIDEMENT;  Surgeon: Tarry Kos, MD;  Location: [facility];  Service: Orthopedics;  Laterality: Right;    There were no vitals filed for this visit.   Subjective Assessment - 01/05/21 1123     Subjective Pt. indicated no pain to report today.  Pt. stated he felt like he made some gains in last week without less pain and sleeping better and using arm more.    Limitations Lifting;House hold activities    Patient Stated Goals Reduce pain    Currently in Pain? No/denies    Pain Onset More than a month ago                [facility] PT Assessment - 01/05/21 0001       Assessment   Medical Diagnosis Z98.890 (ICD-10-CM) - S/P arthroscopy of right shoulder    Referring Provider (PT) Cristie Hem, PA-C    Onset Date/Surgical Date  11/18/20    Hand Dominance Right      Strength   Right Shoulder Flexion 2+/5                           [facility] Adult PT Treatment/Exercise - 01/05/21 0001       Blood Flow Restriction   Blood Flow Restriction Yes      Blood Flow Restriction-Positions    Blood Flow Restriction Position Supine      BFR-Supine   Supine Limb Occulsion Pressure (mmHg) 150   measured with pulse ox   Supine Exercise Pressure (mmHg) 80    Supine Exercise Prescription Comment cuff size 2      Shoulder Exercises: Supine   Flexion Right   BFR 80 mmHg 30x, 3 x 15 c 30 sec breaks     Shoulder Exercises: Seated   Other Seated Exercises lat pull down blue band 3 x 10 bilateral      Shoulder Exercises: Sidelying   External Rotation Right   c towel at side BFR to fatigue x 14, x 10, x2 80 mmHg   ABduction Right   BFR 80 mmHg  2 x 10     Vasopneumatic   Number  Minutes Vasopneumatic  10 minutes    Vasopnuematic Location  Shoulder    Vasopneumatic Pressure Medium    Vasopneumatic Temperature  34                       PT Short Term Goals - 12/10/20 1122       PT SHORT TERM GOAL #1   Title Patient will demonstrate independent use of home exercise program to maintain progress from in clinic treatments.    Time 3    Period Weeks    Status Achieved               PT Long Term Goals - 12/29/20 6761       PT LONG TERM GOAL #1   Title Patient will demonstrate/report pain at worst less than or equal to 2/10 to facilitate minimal limitation in daily activity secondary to pain symptoms.    Time 10    Period Weeks    Status On-going    Target Date 02/04/21      PT LONG TERM GOAL #2   Title Patient will demonstrate independent use of home exercise program to facilitate ability to maintain/progress functional gains from skilled physical therapy services.    Time 10    Period Weeks    Status On-going    Target Date 02/04/21      PT LONG TERM GOAL #3   Title Pt. will  demonstrate FOTO outcome > or = 66% to indicated reduced disability due to condition.    Time 10    Period Weeks    Status On-going    Target Date 02/04/21      PT LONG TERM GOAL #4   Title Patient will demonstrate Rt GH joint mobility WFL to facilitate usual self care, dressing, reaching overhead at PLOF s limitation due to symptoms.    Time 10    Period Weeks    Status On-going    Target Date 02/04/21      PT LONG TERM GOAL #5   Title Patient will demonstrate Rt UE MMT 4/5 or greater throughout to facilitate usual lifting, carrying in functional activity to PLOF s limitation.    Time 10    Period Weeks    Status On-going    Target Date 02/04/21                   Plan - 01/05/21 1124     Clinical Impression Statement Inclusion of blood flow restriction therapy today c overall good tolerance.  Fatigue noted in active movements c BFR on but rest periods help alleviate complaints.  Pt. to contiue to benefit from skilled PT services to progress active movement and strength.    Personal Factors and Comorbidities Comorbidity 1    Comorbidities HTN    Examination-Activity Limitations Caring for Others;Sleep;Bed Mobility;Carry;Dressing;Hygiene/Grooming;Lift;Reach Overhead    Examination-Participation Restrictions Cleaning;Community Activity;Driving;Interpersonal Relationship;Yard Work;Laundry;Meal Prep    Stability/Clinical Decision Making Stable/Uncomplicated    Rehab Potential Good    PT Duration Other (comment)    PT Treatment/Interventions ADLs/Self Care Home Management;Cryotherapy;Electrical Stimulation;Iontophoresis 4mg /ml Dexamethasone;Moist Heat;Balance training;Therapeutic exercise;Therapeutic activities;Functional mobility training;Ultrasound;Neuromuscular re-education;Passive range of motion;Spinal Manipulations;Joint Manipulations;Dry needling;Patient/family education;Taping;Vasopneumatic Device;Manual techniques    PT Next Visit Plan BFR as tolerated in sidelying  avoiding shrug.  progress ER mobility gains.    PT Home Exercise Plan    Consulted and Agree with Plan of Care Patient  Patient will benefit from skilled therapeutic intervention in order to improve the following deficits and impairments:  Decreased endurance, Hypomobility, Increased edema, Pain, Impaired UE functional use, Decreased strength, Decreased activity tolerance, Decreased mobility, Impaired perceived functional ability, Improper body mechanics, Impaired flexibility, Decreased range of motion, Decreased coordination  Visit Diagnosis: Chronic right shoulder pain  Muscle weakness (generalized)  Localized edema     Problem List Patient Active Problem List   Diagnosis Date Noted   Traumatic tear of supraspinatus tendon, right, initial encounter 11/18/2020   Impingement syndrome of right shoulder 11/18/2020   Opioid use disorder, severe, dependence (HCC) 11/14/2015   MDD (major depressive disorder) 11/13/2015    Chyrel Masson, PT, DPT, OCS, ATC 01/05/21  11:38 AM    [facility] Physical Therapy [address] [place], [place], [address] Phone: [phone]   Fax:  [phone]  Name: Eluzer Howdeshell MRN: [id number] Date of Birth: 09/11/1970

## 2021-01-07 ENCOUNTER — Telehealth: Payer: Self-pay | Admitting: Orthopaedic Surgery

## 2021-01-07 ENCOUNTER — Encounter: Payer: Self-pay | Admitting: Rehabilitative and Restorative Service Providers"

## 2021-01-07 ENCOUNTER — Other Ambulatory Visit: Payer: Self-pay | Admitting: Physician Assistant

## 2021-01-07 ENCOUNTER — Other Ambulatory Visit: Payer: Self-pay

## 2021-01-07 ENCOUNTER — Ambulatory Visit (INDEPENDENT_AMBULATORY_CARE_PROVIDER_SITE_OTHER): Payer: BC Managed Care – PPO | Admitting: Rehabilitative and Restorative Service Providers"

## 2021-01-07 DIAGNOSIS — M25511 Pain in right shoulder: Secondary | ICD-10-CM | POA: Diagnosis not present

## 2021-01-07 DIAGNOSIS — G8929 Other chronic pain: Secondary | ICD-10-CM

## 2021-01-07 DIAGNOSIS — M6281 Muscle weakness (generalized): Secondary | ICD-10-CM | POA: Diagnosis not present

## 2021-01-07 DIAGNOSIS — R600 Localized edema: Secondary | ICD-10-CM | POA: Diagnosis not present

## 2021-01-07 MED ORDER — HYDROCODONE-ACETAMINOPHEN 5-325 MG PO TABS: 1.0000 | ORAL_TABLET | Freq: Three times a day (TID) | ORAL | 0 refills | Status: DC | PRN

## 2021-01-07 NOTE — Telephone Encounter (Signed)
Sent in

## 2021-01-07 NOTE — Telephone Encounter (Signed)
Patient is her for PT. He would like a refill on pain medication. His number is [phone]

## 2021-01-07 NOTE — Telephone Encounter (Signed)
Called and notified patient.

## 2021-01-07 NOTE — Therapy (Signed)
[facility] [address] [place], [place], [address] Phone: [phone]   Fax:  [phone]  Physical Therapy Treatment  Patient Details  Name: Micheal Hurley MRN: [id number] Date of Birth: Aug 23, 1970 Referring Provider (PT): Cristie Hem, [place]   Encounter Date: 01/07/2021   PT End of Session - 01/07/21 1023     Visit Number 13    Number of Visits 20    Date for PT Re-Evaluation 02/04/21    Progress Note Due on Visit 20    PT Start Time 1019    PT Stop Time 1058    PT Time Calculation (min) 39 min    Activity Tolerance Patient tolerated treatment well    Behavior During Therapy [facility] for tasks assessed/performed             Past Medical History:  Diagnosis Date   Anxiety    Depression    GERD (gastroesophageal reflux disease)    Hypertension    Substance abuse (HCC)    in remission since 2017    Past Surgical History:  Procedure Laterality Date   NO PAST SURGERIES     SHOULDER ARTHROSCOPY WITH ROTATOR CUFF REPAIR AND SUBACROMIAL DECOMPRESSION Right 11/18/2020   Procedure: RIGHT SHOULDER ARTHROSCOPY WITH ROTATOR CUFF REPAIR AND SUBACROMIAL DECOMPRESSION, AND EXTENSIVE DEBRIDEMENT;  Surgeon: Tarry Kos, MD;  Location: [facility];  Service: Orthopedics;  Laterality: Right;    There were no vitals filed for this visit.   Subjective Assessment - 01/07/21 1023     Subjective Pt. stated feeling pretty good, nothing worse after last visit.  Pt. indicated his triceps and shoulder blade area is tight some.    Limitations Lifting;House hold activities    Patient Stated Goals Reduce pain    Currently in Pain? No/denies    Pain Onset More than a month ago                               [facility] Adult PT Treatment/Exercise - 01/07/21 0001       BFR-Supine   Supine Limb Occulsion Pressure (mmHg) 150    Supine Exercise Pressure (mmHg) 80    Supine Exercise Prescription Comment cuff size 2       Shoulder Exercises: Sidelying   External Rotation Right   BFR 80 mmHg, x 30, 3 x 15 c 30 sec rest breaks   Flexion Right   x 7, x 10, x 10   ABduction Right   BFR 80 mmHg x 40, 3 x 15 c 30 sec breaks     Shoulder Exercises: Standing   Other Standing Exercises wall slide x 10 5 sec hold      Shoulder Exercises: ROM/Strengthening   UBE (Upper Arm Bike) Lvl 2.5 3 mins fwd/back each way c BFR 80 mmHg (shortened from 10 min protocol approach due to arrival time for visit)      Shoulder Exercises: Stretch   Other Shoulder Stretches Detailed home use of supine ER progressive stretching long duration    Other Shoulder Stretches cross arm stretch 15 sec x 5      Vasopneumatic   Number Minutes Vasopneumatic  --    Vasopnuematic Location  --    Vasopneumatic Pressure --    Vasopneumatic Temperature  --      Manual Therapy   Manual therapy comments Mobilization c movement for ER in 90 deg abduction c sustained posterior glide Rt GH  joint.  Contract/relax for same 5 sec on/30 sec stretch                       PT Short Term Goals - 12/10/20 1122       PT SHORT TERM GOAL #1   Title Patient will demonstrate independent use of home exercise program to maintain progress from in clinic treatments.    Time 3    Period Weeks    Status Achieved               PT Long Term Goals - 12/29/20 1696       PT LONG TERM GOAL #1   Title Patient will demonstrate/report pain at worst less than or equal to 2/10 to facilitate minimal limitation in daily activity secondary to pain symptoms.    Time 10    Period Weeks    Status On-going    Target Date 02/04/21      PT LONG TERM GOAL #2   Title Patient will demonstrate independent use of home exercise program to facilitate ability to maintain/progress functional gains from skilled physical therapy services.    Time 10    Period Weeks    Status On-going    Target Date 02/04/21      PT LONG TERM GOAL #3   Title Pt. will demonstrate  FOTO outcome > or = 66% to indicated reduced disability due to condition.    Time 10    Period Weeks    Status On-going    Target Date 02/04/21      PT LONG TERM GOAL #4   Title Patient will demonstrate Rt GH joint mobility WFL to facilitate usual self care, dressing, reaching overhead at PLOF s limitation due to symptoms.    Time 10    Period Weeks    Status On-going    Target Date 02/04/21      PT LONG TERM GOAL #5   Title Patient will demonstrate Rt UE MMT 4/5 or greater throughout to facilitate usual lifting, carrying in functional activity to PLOF s limitation.    Time 10    Period Weeks    Status On-going    Target Date 02/04/21                   Plan - 01/07/21 1024     Clinical Impression Statement Continued use of BFR in active movement/strengthening today.  AROM in sidelying improved c better endurance noted.  Continued skilled PT services warranted at this time.    Personal Factors and Comorbidities Comorbidity 1    Comorbidities HTN    Examination-Activity Limitations Caring for Others;Sleep;Bed Mobility;Carry;Dressing;Hygiene/Grooming;Lift;Reach Overhead    Examination-Participation Restrictions Cleaning;Community Activity;Driving;Interpersonal Relationship;Yard Work;Laundry;Meal Prep    Stability/Clinical Decision Making Stable/Uncomplicated    Rehab Potential Good    PT Duration Other (comment)    PT Treatment/Interventions ADLs/Self Care Home Management;Cryotherapy;Electrical Stimulation;Iontophoresis 4mg /ml Dexamethasone;Moist Heat;Balance training;Therapeutic exercise;Therapeutic activities;Functional mobility training;Ultrasound;Neuromuscular re-education;Passive range of motion;Spinal Manipulations;Joint Manipulations;Dry needling;Patient/family education;Taping;Vasopneumatic Device;Manual techniques    PT Next Visit Plan Continue use of BFR    PT Home Exercise Plan    Consulted and Agree with Plan of Care Patient             Patient  will benefit from skilled therapeutic intervention in order to improve the following deficits and impairments:  Decreased endurance, Hypomobility, Increased edema, Pain, Impaired UE functional use, Decreased strength, Decreased activity tolerance, Decreased mobility, Impaired perceived functional ability,  Improper body mechanics, Impaired flexibility, Decreased range of motion, Decreased coordination  Visit Diagnosis: Chronic right shoulder pain  Muscle weakness (generalized)  Localized edema     Problem List Patient Active Problem List   Diagnosis Date Noted   Traumatic tear of supraspinatus tendon, right, initial encounter 11/18/2020   Impingement syndrome of right shoulder 11/18/2020   Opioid use disorder, severe, dependence (HCC) 11/14/2015   MDD (major depressive disorder) 11/13/2015   Chyrel Masson, PT, DPT, OCS, ATC 01/07/21  10:58 AM    [facility] Physical Therapy [address] [place], [place], [address] Phone: [phone]   Fax:  [phone]  Name: Micheal Hurley MRN: [id number] Date of Birth: [birth year]

## 2021-01-10 ENCOUNTER — Other Ambulatory Visit: Payer: Self-pay | Admitting: Podiatry

## 2021-01-12 ENCOUNTER — Encounter: Payer: BC Managed Care – PPO | Admitting: Rehabilitative and Restorative Service Providers"

## 2021-01-13 ENCOUNTER — Encounter: Payer: BC Managed Care – PPO | Admitting: Rehabilitative and Restorative Service Providers"

## 2021-01-13 ENCOUNTER — Telehealth: Payer: Self-pay | Admitting: Rehabilitative and Restorative Service Providers"

## 2021-01-13 NOTE — Telephone Encounter (Signed)
Unable to leave message on mailbox for missed appointment today.  Chyrel Masson, PT, DPT, OCS, ATC 01/13/21  11:17 AM

## 2021-01-14 ENCOUNTER — Ambulatory Visit (INDEPENDENT_AMBULATORY_CARE_PROVIDER_SITE_OTHER): Payer: BC Managed Care – PPO | Admitting: Rehabilitative and Restorative Service Providers"

## 2021-01-14 ENCOUNTER — Encounter: Payer: Self-pay | Admitting: Rehabilitative and Restorative Service Providers"

## 2021-01-14 ENCOUNTER — Other Ambulatory Visit: Payer: Self-pay

## 2021-01-14 DIAGNOSIS — M25511 Pain in right shoulder: Secondary | ICD-10-CM

## 2021-01-14 DIAGNOSIS — G8929 Other chronic pain: Secondary | ICD-10-CM | POA: Diagnosis not present

## 2021-01-14 DIAGNOSIS — M6281 Muscle weakness (generalized): Secondary | ICD-10-CM

## 2021-01-14 DIAGNOSIS — R600 Localized edema: Secondary | ICD-10-CM

## 2021-01-14 NOTE — Patient Instructions (Signed)
Access Code: [email or web address] URL: [email or web address] Date: 01/14/2021 Prepared by: Chyrel Masson  Exercises Supine Shoulder Flexion AAROM with Hands Clasped - 2 x daily - 7 x weekly - 3 sets - 10 reps - 5 hold Sidelying Shoulder Abduction Palm Forward - 2 x daily - 7 x weekly - 3 sets - 10-15 reps Sidelying Shoulder External Rotation (Mirrored) - 2 x daily - 7 x weekly - 3 sets - 10 reps Standing Shoulder External Rotation Stretch in Doorway (Mirrored) - 1-2 x daily - 7 x weekly - 1 sets - 3-5 reps - 30 hold Standing Shoulder Flexion to 90 Degrees with Dumbbells - 2 x daily - 7 x weekly - 3 sets - 10 reps Standing shoulder flexion wall slides - 2 x daily - 7 x weekly - 1 sets - 10 reps - 5 sec hold hold Shoulder External Rotation with Anchored Resistance (Mirrored) - 2 x daily - 7 x weekly - 3 sets - 10 reps Shoulder Internal Rotation with Resistance - 2 x daily - 7 x weekly - 3 sets - 10 reps

## 2021-01-14 NOTE — Therapy (Signed)
[facility] Physical Therapy [address] [place], [place], [address] Phone: [phone]   Fax:  [phone]  Physical Therapy Treatment  Patient Details  Name: Micheal Hurley MRN: [id number] Date of Birth: 29-Mar-1970 Referring Provider (PT): Cristie Hem, [place]   Encounter Date: 01/14/2021   PT End of Session - 01/14/21 1017     Visit Number 14    Number of Visits 20    Date for PT Re-Evaluation 02/04/21    Progress Note Due on Visit 20    PT Start Time 1015    PT Stop Time 1055    PT Time Calculation (min) 40 min    Activity Tolerance Patient tolerated treatment well    Behavior During Therapy [facility] for tasks assessed/performed             Past Medical History:  Diagnosis Date   Anxiety    Depression    GERD (gastroesophageal reflux disease)    Hypertension    Substance abuse (HCC)    in remission since 2017    Past Surgical History:  Procedure Laterality Date   NO PAST SURGERIES     SHOULDER ARTHROSCOPY WITH ROTATOR CUFF REPAIR AND SUBACROMIAL DECOMPRESSION Right 11/18/2020   Procedure: RIGHT SHOULDER ARTHROSCOPY WITH ROTATOR CUFF REPAIR AND SUBACROMIAL DECOMPRESSION, AND EXTENSIVE DEBRIDEMENT;  Surgeon: Tarry Kos, MD;  Location: [facility];  Service: Orthopedics;  Laterality: Right;    There were no vitals filed for this visit.   Subjective Assessment - 01/14/21 1017     Subjective Pt. reported having to do transportation of daugther this week that has fallen at his appointment times here.  Pt. indicated no specific pain today upon arrival, just some sore this morning waking up after sleeping.    Limitations Lifting;House hold activities    Patient Stated Goals Reduce pain    Currently in Pain? No/denies    Pain Score 0-No pain    Pain Onset More than a month ago                [facility] PT Assessment - 01/14/21 0001       Assessment   Medical Diagnosis Z98.890 (ICD-10-CM) - S/P arthroscopy of right shoulder     Referring Provider (PT) Cristie Hem, PA-C    Onset Date/Surgical Date 11/18/20    Hand Dominance Right      AROM   Overall AROM Comments standing flexion against gravity to 95 degrees prior to mild shrug onset                           [facility] Adult PT Treatment/Exercise - 01/14/21 0001       Blood Flow Restriction-Positions    Blood Flow Restriction Position Standing      BFR Standing   Standing Limb Occulsion Pressure (mmHg) 150    Standing Exercise Pressure (mmHg) 80    Standing Exercise Prescription Comment cuff size 2      Exercises   Other Exercises  New HEP progression education c cues      Shoulder Exercises: Standing   Internal Rotation Right   3x10   Theraband Level (Shoulder Internal Rotation) Level 3 (Green)    Flexion Strengthening;Both   Rt BFR 80 mmHg 30x, 3 x 15 30 sec(0-90 degrees to avoid shrug)   Other Standing Exercises wall slide x 10 5 sec hold    Other Standing Exercises green band ER c flexion punch to 90  degrees 3 x 10, wall ball circles 2 lb ball 30 x CW, CCW      Shoulder Exercises: Pulleys   Other Pulley Exercises Pulleys for home use at this time      Shoulder Exercises: ROM/Strengthening   UBE (Upper Arm Bike) Lvl 4 4 mins fwd/back each way c interval at :45 - :60 of each minute faster      Shoulder Exercises: Stretch   Other Shoulder Stretches ER wall stretch at side 30 sec x 3 Rt                     PT Education - 01/14/21 1044     Education Details HEP progressed    Person(s) Educated Patient    Methods Explanation;Demonstration;Verbal cues;Handout    Comprehension Verbalized understanding;Returned demonstration              PT Short Term Goals - 12/10/20 1122       PT SHORT TERM GOAL #1   Title Patient will demonstrate independent use of home exercise program to maintain progress from in clinic treatments.    Time 3    Period Weeks    Status Achieved               PT Long Term  Goals - 12/29/20 0981       PT LONG TERM GOAL #1   Title Patient will demonstrate/report pain at worst less than or equal to 2/10 to facilitate minimal limitation in daily activity secondary to pain symptoms.    Time 10    Period Weeks    Status On-going    Target Date 02/04/21      PT LONG TERM GOAL #2   Title Patient will demonstrate independent use of home exercise program to facilitate ability to maintain/progress functional gains from skilled physical therapy services.    Time 10    Period Weeks    Status On-going    Target Date 02/04/21      PT LONG TERM GOAL #3   Title Pt. will demonstrate FOTO outcome > or = 66% to indicated reduced disability due to condition.    Time 10    Period Weeks    Status On-going    Target Date 02/04/21      PT LONG TERM GOAL #4   Title Patient will demonstrate Rt GH joint mobility WFL to facilitate usual self care, dressing, reaching overhead at PLOF s limitation due to symptoms.    Time 10    Period Weeks    Status On-going    Target Date 02/04/21      PT LONG TERM GOAL #5   Title Patient will demonstrate Rt UE MMT 4/5 or greater throughout to facilitate usual lifting, carrying in functional activity to PLOF s limitation.    Time 10    Period Weeks    Status On-going    Target Date 02/04/21                   Plan - 01/14/21 1029     Clinical Impression Statement Pt. demonstrated improvement in active movement against gravity, demonstrating to 90-95 degrees prior to mild shrug.  Able to incorporate standing movements against gravity in HEP with performance in mirror to avoid shrug onset.  Continued end range mobility deficits in ER, IR specifically.  Skilled PT services continued indicated.    Personal Factors and Comorbidities Comorbidity 1    Comorbidities HTN    Examination-Activity  Limitations Caring for Others;Sleep;Bed Mobility;Carry;Dressing;Hygiene/Grooming;Lift;Reach Overhead    Examination-Participation Restrictions  Cleaning;Community Activity;Driving;Interpersonal Relationship;Yard Work;Laundry;Meal Prep    Stability/Clinical Decision Making Stable/Uncomplicated    Rehab Potential Good    PT Duration Other (comment)    PT Treatment/Interventions ADLs/Self Care Home Management;Cryotherapy;Electrical Stimulation;Iontophoresis 4mg /ml Dexamethasone;Moist Heat;Balance training;Therapeutic exercise;Therapeutic activities;Functional mobility training;Ultrasound;Neuromuscular re-education;Passive range of motion;Spinal Manipulations;Joint Manipulations;Dry needling;Patient/family education;Taping;Vasopneumatic Device;Manual techniques    PT Next Visit Plan BFR, gravity resisted movements avoiding shrug.    PT Home Exercise Plan    Consulted and Agree with Plan of Care Patient             Patient will benefit from skilled therapeutic intervention in order to improve the following deficits and impairments:  Decreased endurance, Hypomobility, Increased edema, Pain, Impaired UE functional use, Decreased strength, Decreased activity tolerance, Decreased mobility, Impaired perceived functional ability, Improper body mechanics, Impaired flexibility, Decreased range of motion, Decreased coordination  Visit Diagnosis: Chronic right shoulder pain  Muscle weakness (generalized)  Localized edema     Problem List Patient Active Problem List   Diagnosis Date Noted   Traumatic tear of supraspinatus tendon, right, initial encounter 11/18/2020   Impingement syndrome of right shoulder 11/18/2020   Opioid use disorder, severe, dependence (HCC) 11/14/2015   MDD (major depressive disorder) 11/13/2015    11/15/2015, PT, DPT, OCS, ATC 01/14/21  10:53 AM   [facility] Physical Therapy [address] [place], [place], [place] Phone: [phone]   Fax:  [phone]  Name: Derrious Bologna MRN: Therese Sarah Date of Birth: 06-Jan-1971

## 2021-01-20 ENCOUNTER — Encounter: Payer: BC Managed Care – PPO | Admitting: Rehabilitative and Restorative Service Providers"

## 2021-01-20 ENCOUNTER — Telehealth: Payer: Self-pay | Admitting: Rehabilitative and Restorative Service Providers"

## 2021-01-20 NOTE — Telephone Encounter (Signed)
Unable to leave message about no show for appointment on phone message symptoms.  Chyrel Masson, PT, DPT, OCS, ATC 01/20/21  10:34 AM  w

## 2021-01-22 ENCOUNTER — Encounter: Payer: BC Managed Care – PPO | Admitting: Rehabilitative and Restorative Service Providers"

## 2021-01-22 ENCOUNTER — Telehealth: Payer: Self-pay | Admitting: Orthopaedic Surgery

## 2021-01-22 ENCOUNTER — Other Ambulatory Visit: Payer: Self-pay | Admitting: Physician Assistant

## 2021-01-22 MED ORDER — HYDROCODONE-ACETAMINOPHEN 5-325 MG PO TABS: 1.0000 | ORAL_TABLET | Freq: Two times a day (BID) | ORAL | 0 refills | Status: DC | PRN

## 2021-01-22 NOTE — Telephone Encounter (Signed)
Patient called. He would like a refill on his pain medication.  °

## 2021-01-22 NOTE — Telephone Encounter (Signed)
Sent in

## 2021-01-25 ENCOUNTER — Ambulatory Visit (INDEPENDENT_AMBULATORY_CARE_PROVIDER_SITE_OTHER): Payer: BC Managed Care – PPO | Admitting: Rehabilitative and Restorative Service Providers"

## 2021-01-25 ENCOUNTER — Encounter: Payer: Self-pay | Admitting: Rehabilitative and Restorative Service Providers"

## 2021-01-25 ENCOUNTER — Other Ambulatory Visit: Payer: Self-pay

## 2021-01-25 DIAGNOSIS — G8929 Other chronic pain: Secondary | ICD-10-CM | POA: Diagnosis not present

## 2021-01-25 DIAGNOSIS — M6281 Muscle weakness (generalized): Secondary | ICD-10-CM

## 2021-01-25 DIAGNOSIS — R600 Localized edema: Secondary | ICD-10-CM | POA: Diagnosis not present

## 2021-01-25 DIAGNOSIS — M25511 Pain in right shoulder: Secondary | ICD-10-CM

## 2021-01-25 NOTE — Therapy (Addendum)
[facility] Physical Therapy [address] [place], [place], [address] Phone: [phone]   Fax:  [phone]  Physical Therapy Treatment /Discharge   Patient Details  Name: Micheal Hurley MRN: [id number] Date of Birth: Feb 03, 1971 Referring Provider (PT): Aundra Dubin, [place]   Encounter Date: 01/25/2021   PT End of Session - 01/25/21 1017     Visit Number 15    Number of Visits 20    Date for PT Re-Evaluation 02/04/21    Progress Note Due on Visit 20    PT Start Time 1014    PT Stop Time 1053    PT Time Calculation (min) 39 min    Activity Tolerance Patient tolerated treatment well    Behavior During Therapy [facility] for tasks assessed/performed             Past Medical History:  Diagnosis Date   Anxiety    Depression    GERD (gastroesophageal reflux disease)    Hypertension    Substance abuse ([place])    in remission since 2017    Past Surgical History:  Procedure Laterality Date   NO PAST SURGERIES     SHOULDER ARTHROSCOPY WITH ROTATOR CUFF REPAIR AND SUBACROMIAL DECOMPRESSION Right 11/18/2020   Procedure: RIGHT SHOULDER ARTHROSCOPY WITH ROTATOR CUFF REPAIR AND SUBACROMIAL DECOMPRESSION, AND EXTENSIVE DEBRIDEMENT;  Surgeon: Leandrew Koyanagi, MD;  Location: [place];  Service: Orthopedics;  Laterality: Right;    There were no vitals filed for this visit.   Subjective Assessment - 01/25/21 1016     Subjective Pt. indicated waking up last night and felt the top of his shoulder was tight and locked up.  Pt. stated a little sore this morning.  Pt. stated life has impacted his ability to attend visits.    Limitations Lifting;House hold activities    Patient Stated Goals Reduce pain    Currently in Pain? No/denies   no pain, just sore   Pain Score 0-No pain    Pain Onset More than a month ago                [facility] PT Assessment - 01/25/21 0001       Assessment   Medical Diagnosis Z98.890 (ICD-10-CM) - S/P arthroscopy of right  shoulder    Referring Provider (PT) Aundra Dubin, PA-C    Onset Date/Surgical Date 11/18/20    Hand Dominance Right      Strength   Right Shoulder Flexion --   29.8, 30.9 lbs   Left Shoulder Flexion --   54, 46 lbs                          [facility] Adult PT Treatment/Exercise - 01/25/21 0001       Shoulder Exercises: Prone   Other Prone Exercises prone y, t 2 x 10 each      Shoulder Exercises: Standing   Other Standing Exercises wall slide c lift off 2 x 10, standing green band ER c flexion punch Rt 3 x 10, standing wall push up 3 x 10 c SA press    Other Standing Exercises standing flexion 90 deg to abduction to side and return 2 x 10 each, standing in lunge at wall thoracic/horizontal abduction red  x 10 bilateral      Shoulder Exercises: ROM/Strengthening   UBE (Upper Arm Bike) Lvl 5 3 mins fwd/back each way      Shoulder Exercises: Stretch   Other Shoulder  Stretches ER doorway stretch in 75-90 deg abduction 30 sec x 5 Rt                       PT Short Term Goals - 12/10/20 1122       PT SHORT TERM GOAL #1   Title Patient will demonstrate independent use of home exercise program to maintain progress from in clinic treatments.    Time 3    Period Weeks    Status Achieved               PT Long Term Goals - 12/29/20 2197       PT LONG TERM GOAL #1   Title Patient will demonstrate/report pain at worst less than or equal to 2/10 to facilitate minimal limitation in daily activity secondary to pain symptoms.    Time 10    Period Weeks    Status On-going    Target Date 02/04/21      PT LONG TERM GOAL #2   Title Patient will demonstrate independent use of home exercise program to facilitate ability to maintain/progress functional gains from skilled physical therapy services.    Time 10    Period Weeks    Status On-going    Target Date 02/04/21      PT LONG TERM GOAL #3   Title Pt. will demonstrate FOTO outcome > or = 66% to  indicated reduced disability due to condition.    Time 10    Period Weeks    Status On-going    Target Date 02/04/21      PT LONG TERM GOAL #4   Title Patient will demonstrate Rt [place] joint mobility WFL to facilitate usual self care, dressing, reaching overhead at PLOF s limitation due to symptoms.    Time 10    Period Weeks    Status On-going    Target Date 02/04/21      PT LONG TERM GOAL #5   Title Patient will demonstrate Rt UE MMT 4/5 or greater throughout to facilitate usual lifting, carrying in functional activity to PLOF s limitation.    Time 10    Period Weeks    Status On-going    Target Date 02/04/21                   Plan - 01/25/21 1041     Clinical Impression Statement Dynamometry assessment in flexion strength noted bout 60% of Lt today.  Continued skilled PT services to improve end range mobility and functional strength indicated at this time.  More recent progress possibly impacted due to missed visits along the way (cues for importance of HEP).    Personal Factors and Comorbidities Comorbidity 1    Comorbidities HTN    Examination-Activity Limitations Caring for Others;Sleep;Bed Mobility;Carry;Dressing;Hygiene/Grooming;Lift;Reach Overhead    Examination-Participation Restrictions Cleaning;Community Activity;Driving;Interpersonal Relationship;Yard Work;Laundry;Meal Prep    Stability/Clinical Decision Making Stable/Uncomplicated    Rehab Potential Good    PT Duration Other (comment)    PT Treatment/Interventions ADLs/Self Care Home Management;Cryotherapy;Electrical Stimulation;Iontophoresis 20m/ml Dexamethasone;Moist Heat;Balance training;Therapeutic exercise;Therapeutic activities;Functional mobility training;Ultrasound;Neuromuscular re-education;Passive range of motion;Spinal Manipulations;Joint Manipulations;Dry needling;Patient/family education;Taping;Vasopneumatic Device;Manual techniques    PT Next Visit Plan Progress functional strength and end range  mobility    PT Home Exercise Plan [facility]   Consulted and Agree with Plan of Care Patient             Patient will benefit from skilled therapeutic intervention in order to improve the  following deficits and impairments:  Decreased endurance, Hypomobility, Increased edema, Pain, Impaired UE functional use, Decreased strength, Decreased activity tolerance, Decreased mobility, Impaired perceived functional ability, Improper body mechanics, Impaired flexibility, Decreased range of motion, Decreased coordination  Visit Diagnosis: Chronic right shoulder pain  Muscle weakness (generalized)  Localized edema     Problem List Patient Active Problem List   Diagnosis Date Noted   Traumatic tear of supraspinatus tendon, right, initial encounter 11/18/2020   Impingement syndrome of right shoulder 11/18/2020   Opioid use disorder, severe, dependence ([place]) 11/14/2015   MDD (major depressive disorder) 11/13/2015    Scot Jun, PT, DPT, OCS, ATC 01/25/21  10:51 AM  PHYSICAL THERAPY DISCHARGE SUMMARY  Visits from Start of Care: 15  Current functional level related to goals / functional outcomes: See note   Remaining deficits: See note   Education / Equipment: HEP   Patient agrees to discharge. Patient goals were partially met. Patient is being discharged due to not returning since the last visit. Scot Jun, PT, DPT, OCS, ATC 03/08/21  10:07 AM     [facility] Physical Therapy [address] [place], [place], [address] Phone: [phone]   Fax:  [phone]  Name: Micheal Hurley MRN: [id number] Date of Birth: Aug 03, 1970

## 2021-01-28 ENCOUNTER — Encounter: Payer: BC Managed Care – PPO | Admitting: Rehabilitative and Restorative Service Providers"

## 2021-01-28 ENCOUNTER — Telehealth: Payer: Self-pay | Admitting: Rehabilitative and Restorative Service Providers"

## 2021-01-28 NOTE — Telephone Encounter (Signed)
Called after no show for appointment.  Unable to leave message or inform of clinic policy.  Due to inability to talk to patient, left next visit scheduled as is.   Chyrel Masson, PT, DPT, OCS, ATC 01/28/21  11:23 AM

## 2021-02-02 ENCOUNTER — Encounter: Payer: BC Managed Care – PPO | Admitting: Rehabilitative and Restorative Service Providers"

## 2021-02-04 ENCOUNTER — Encounter: Payer: BC Managed Care – PPO | Admitting: Rehabilitative and Restorative Service Providers"

## 2021-02-09 ENCOUNTER — Other Ambulatory Visit: Payer: Self-pay | Admitting: Physician Assistant

## 2021-02-09 ENCOUNTER — Telehealth: Payer: Self-pay | Admitting: Orthopaedic Surgery

## 2021-02-09 MED ORDER — HYDROCODONE-ACETAMINOPHEN 5-325 MG PO TABS: 1.0000 | ORAL_TABLET | Freq: Every day | ORAL | 0 refills | Status: DC | PRN

## 2021-02-09 NOTE — Telephone Encounter (Signed)
Pt called asking for a refill of his hydrocodone rx; he would like a CB when it's called in please.   [phone]

## 2021-02-09 NOTE — Telephone Encounter (Signed)
Sent in one last refill.  Need to wean off this rx

## 2021-02-11 NOTE — Telephone Encounter (Signed)
Rx sent into pharm. Called patient no answer.

## 2021-04-07 ENCOUNTER — Telehealth: Payer: Self-pay | Admitting: Orthopaedic Surgery

## 2021-04-07 NOTE — Telephone Encounter (Signed)
Pt called requesting refill of hydrocodone. Please send to pharmacy on file. Pt phone number is [phone].

## 2021-04-07 NOTE — Telephone Encounter (Signed)
Pt also wanted to inform Dr. Erlinda Hong of more pain at night

## 2021-04-08 ENCOUNTER — Other Ambulatory Visit: Payer: Self-pay | Admitting: Physician Assistant

## 2021-04-08 MED ORDER — HYDROCODONE-ACETAMINOPHEN 5-325 MG PO TABS: 1.0000 | ORAL_TABLET | Freq: Every day | ORAL | 0 refills | Status: DC | PRN

## 2021-04-08 NOTE — Telephone Encounter (Signed)
Sent in.  He can come in for repeat eval if needed

## 2021-04-14 ENCOUNTER — Other Ambulatory Visit: Payer: Self-pay

## 2021-04-14 ENCOUNTER — Encounter: Payer: Self-pay | Admitting: Orthopaedic Surgery

## 2021-04-14 ENCOUNTER — Ambulatory Visit (INDEPENDENT_AMBULATORY_CARE_PROVIDER_SITE_OTHER): Payer: BC Managed Care – PPO | Admitting: Orthopaedic Surgery

## 2021-04-14 DIAGNOSIS — S46811A Strain of other muscles, fascia and tendons at shoulder and upper arm level, right arm, initial encounter: Secondary | ICD-10-CM

## 2021-04-14 DIAGNOSIS — Z9889 Other specified postprocedural states: Secondary | ICD-10-CM | POA: Diagnosis not present

## 2021-04-14 MED ORDER — TRAMADOL HCL 50 MG PO TABS: 50.0000 mg | ORAL_TABLET | Freq: Every day | ORAL | 0 refills | Status: DC | PRN

## 2021-04-14 NOTE — Progress Notes (Signed)
Post-Op Visit Note   Patient: Micheal Hurley           Date of Birth: September 21, 1970           MRN: [id number] Visit Date: 04/14/2021 PCP: Pcp, No   Assessment & Plan:  Chief Complaint:  Chief Complaint  Patient presents with   Right Shoulder - Pain   Visit Diagnoses:  1. S/P arthroscopy of right shoulder   2. Traumatic tear of supraspinatus tendon of right shoulder, initial encounter     Plan: Mr. Hallum is status post rotator cuff repair on 11/18/2020.  He switched jobs in November and lost his insurance so there was a lapse in his physical therapy.  He states that has been doing home exercises on his own.  He feels some pain at nighttime that occasionally requires some hydrocodone.  Has been doing some shadow boxing as well.  He states that he has some pain when he is laying underneath sinks to repair them.  Right shoulder shows fully healed surgical scars.  Flexion to 170 abduction to 110 external rotation 35 internal rotation T10.  Testing of rotator cuff strength is normal and without pain.  [name] is now approximately 5 months from the surgery.  He is overall doing well making progress.  Tramadol prescribed for breakthrough nighttime pain.  I do think it is worth sending him back to outpatient PT. recheck in 3 months.  Follow-Up Instructions: Return in about 3 months (around 07/13/2021).   Orders:  Orders Placed This Encounter  Procedures   Ambulatory referral to Physical Therapy   Meds ordered this encounter  Medications   traMADol (ULTRAM) 50 MG tablet    Sig: Take 1-2 tablets (50-100 mg total) by mouth daily as needed.    Dispense:  20 tablet    Refill:  0    Imaging: No results found.  PMFS History: Patient Active Problem List   Diagnosis Date Noted   Traumatic tear of supraspinatus tendon, right, initial encounter 11/18/2020   Impingement syndrome of right shoulder 11/18/2020   Opioid use disorder, severe, dependence ([place]) 11/14/2015   MDD (major depressive  disorder) 11/13/2015   Past Medical History:  Diagnosis Date   Anxiety    Depression    GERD (gastroesophageal reflux disease)    Hypertension    Substance abuse ([place])    in remission since 2017    Family History  Problem Relation Age of Onset   Mental illness Neg Hx     Past Surgical History:  Procedure Laterality Date   NO PAST SURGERIES     SHOULDER ARTHROSCOPY WITH ROTATOR CUFF REPAIR AND SUBACROMIAL DECOMPRESSION Right 11/18/2020   Procedure: RIGHT SHOULDER ARTHROSCOPY WITH ROTATOR CUFF REPAIR AND SUBACROMIAL DECOMPRESSION, AND EXTENSIVE DEBRIDEMENT;  Surgeon: Leandrew Koyanagi, MD;  Location: [place];  Service: Orthopedics;  Laterality: Right;   Social History   Occupational History   Not on file  Tobacco Use   Smoking status: Former    Packs/day: 0.50    Years: 20.00    Pack years: 10.00    Types: Cigarettes    Quit date: 07/30/2016    Years since quitting: 4.7   Smokeless tobacco: Never  Vaping Use   Vaping Use: Never used  Substance and Sexual Activity   Alcohol use: No    Comment: history   Drug use: Yes    Comment: opiates - quit  August 2017   Sexual activity: Not on file

## 2021-04-15 ENCOUNTER — Encounter (INDEPENDENT_AMBULATORY_CARE_PROVIDER_SITE_OTHER): Payer: Self-pay | Admitting: Rehabilitative and Restorative Service Providers"

## 2021-04-15 ENCOUNTER — Encounter: Payer: Self-pay | Admitting: Rehabilitative and Restorative Service Providers"

## 2021-04-15 NOTE — Progress Notes (Signed)
This encounter was created in error - please disregard.

## 2021-04-27 NOTE — Therapy (Incomplete)
OUTPATIENT PHYSICAL THERAPY SHOULDER EVALUATION   Patient Name: Micheal Hurley MRN: [id number] DOB:03-10-1971, 51 y.o., male Today's Date: 04/27/2021    Past Medical History:  Diagnosis Date   Anxiety    Depression    GERD (gastroesophageal reflux disease)    Hypertension    Substance abuse ([place])    in remission since 2017   Past Surgical History:  Procedure Laterality Date   NO PAST SURGERIES     SHOULDER ARTHROSCOPY WITH ROTATOR CUFF REPAIR AND SUBACROMIAL DECOMPRESSION Right 11/18/2020   Procedure: RIGHT SHOULDER ARTHROSCOPY WITH ROTATOR CUFF REPAIR AND SUBACROMIAL DECOMPRESSION, AND EXTENSIVE DEBRIDEMENT;  Surgeon: Leandrew Koyanagi, MD;  Location: [facility];  Service: Orthopedics;  Laterality: Right;   Patient Active Problem List   Diagnosis Date Noted   Traumatic tear of supraspinatus tendon, right, initial encounter 11/18/2020   Impingement syndrome of right shoulder 11/18/2020   Opioid use disorder, severe, dependence ([place]) 11/14/2015   MDD (major depressive disorder) 11/13/2015    PCP: Pcp, No  REFERRING PROVIDER: Leandrew Koyanagi, MD  REFERRING DIAG: Z98.890 (ICD-10-CM) - S/P arthroscopy of right shoulder S46.811A (ICD-10-CM) - Traumatic tear of supraspinatus tendon of right shoulder, initial encounter  THERAPY DIAG:  No diagnosis found.   ONSET DATE: 11/18/2020 surgery  SUBJECTIVE:                                                                                                                                                                                      SUBJECTIVE STATEMENT: ***  PERTINENT HISTORY: ***  PAIN:  Are you having pain? Yes NPRS scale: ***/10 Pain location: Rt shoulder Pain orientation: Right  PAIN TYPE: surgical, chronic Pain description: {PAIN DESCRIPTION:21022940}  Aggravating factors: *** Relieving factors: ***  PRECAUTIONS: None (has progressed long enough since surgery date)  WEIGHT BEARING RESTRICTIONS  No  FALLS:  Has patient fallen in last 6 months? No Number of falls: 0   LIVING ENVIRONMENT: Lives with: {[facility] lives with:25569::"lives with their family"} Lives in: {Lives in:25570}   OCCUPATION: ***  PLOF: Independent  PATIENT GOALS Reduce pain, get stronger  OBJECTIVE:     PATIENT SURVEYS:  FOTO intake:   predicted:   COGNITION:  Overall cognitive status: Within functional limits for tasks assessed     SENSATION:  Light touch: Appears intact    POSTURE: ***  PALPATION: ***  UPPER EXTREMITY AROM/PROM:  A/PROM Right AROM (PROM) 04/27/2021 Left (AROM) 04/27/2021  Shoulder flexion    Shoulder extension    Shoulder abduction    Shoulder adduction    Shoulder internal rotation    Shoulder external rotation    Elbow flexion  Elbow extension    Wrist flexion    Wrist extension    Wrist ulnar deviation    Wrist radial deviation    Wrist pronation    Wrist supination    (Blank rows = not tested)  UPPER EXTREMITY MMT:  MMT Right 04/27/2021 Left 04/27/2021  Shoulder flexion    Shoulder extension    Shoulder abduction    Shoulder adduction    Shoulder internal rotation    Shoulder external rotation    Middle trapezius    Lower trapezius    Elbow flexion    Elbow extension    Wrist flexion    Wrist extension    Wrist ulnar deviation    Wrist radial deviation    Wrist pronation    Wrist supination    Grip strength (lbs)    (Blank rows = not tested)  SHOULDER SPECIAL TESTS:  Impingement tests: {shoulder impingement test:25231:a}  SLAP lesions: {SLAP lesions:25232}  Instability tests: {shoulder instability test:25233}  Rotator cuff assessment: {rotator cuff assessment:25234}  Biceps assessment: {biceps assessment:25235}  JOINT MOBILITY TESTING:  ***  PALPATION:  ***  POSTURE:  ***   TODAY'S TREATMENT:  ***   PATIENT EDUCATION: Education details: HEP, POC Person educated: Patient Education method: Consulting civil engineer, Demonstration,  Verbal cues, and Handouts Education comprehension: verbalized understanding and returned demonstration   HOME EXERCISE PROGRAM: ***  ASSESSMENT:  CLINICAL IMPRESSION: Patient is a 50 y.o. who comes to clinic with complaints of Rt shoulder pain s/p surgery to repair supraspinatus on 11/18/2021 with therapy services previously in fall.  Continued complaints with mobility, strength and movement coordination deficits that impair their ability to perform usual daily and recreational functional activities without increase difficulty/symptoms at this time.  Patient to benefit from skilled PT services to address impairments and limitations to improve to previous level of function without restriction secondary to condition. Objective impairments include decreased activity tolerance, decreased coordination, decreased endurance, decreased mobility, decreased ROM, decreased strength, increased edema, increased fascial restrictions, impaired perceived functional ability, impaired flexibility, impaired UE functional use, improper body mechanics, postural dysfunction, and pain. These impairments are limiting patient from cleaning, community activity, occupation, and yard work. Personal factors including Time since onset of injury/illness/exacerbation are also affecting patient's functional outcome. Patient will benefit from skilled PT to address above impairments and improve overall function.  REHAB POTENTIAL: Good  CLINICAL DECISION MAKING: Stable/uncomplicated  EVALUATION COMPLEXITY: Low   GOALS: Goals reviewed with patient? Yes  SHORT TERM GOALS:  STG Name Target Date Goal status  1 Patient will demonstrate independent use of home exercise program to maintain progress from in clinic treatments.  {follow up:25551} {GOALSTATUS:25110}                                 LONG TERM GOALS:   LTG Name Target Date Goal status  1 Patient will demonstrate/report pain at worst less than or equal to 2/10 to  facilitate minimal limitation in daily activity secondary to pain symptoms.  {follow up:25551} {GOALSTATUS:25110}  2 Patient will demonstrate independent use of home exercise program to facilitate ability to maintain/progress functional gains from skilled physical therapy services.  {follow up:25551} {GOALSTATUS:25110}  3 Pt. will demonstrate FOTO outcome > or = __% to indicated reduced disability due to condition. {follow up:25551} {GOALSTATUS:25110}  4 Patient will demonstrate Rt  [place] joint mobility WFL to facilitate usual self care, dressing, reaching overhead at PLOF s limitation due to symptoms.  {  follow up:25551} {GOALSTATUS:25110}  5 Patient will demonstrate Rt UE MMT 5/5 throughout to facilitate usual lifting, carrying in functional activity to PLOF s limitation. {follow up:25551} {GOALSTATUS:25110}  6 *** Baseline: {follow up:25551} {GOALSTATUS:25110}  7 *** Baseline: {follow up:25551} {GOALSTATUS:25110}   PLAN: PT FREQUENCY: 1-2x/week  PT DURATION: 10 weeks  PLANNED INTERVENTIONS: Therapeutic exercises, Therapeutic activity, Neuro Muscular re-education, Balance training, Gait training, Patient/Family education, Joint mobilization, Stair training, DME instructions, Dry Needling, Electrical stimulation, Cryotherapy, Moist heat, Taping, Ultrasound, Ionotophoresis 4mg /ml Dexamethasone, and Manual therapy  PLAN FOR NEXT SESSION: review existing HEP, progress posterior musculature strengthening, scapular control improvements.    Girtha Rm, PT 04/27/2021, 3:18 PM

## 2021-04-28 ENCOUNTER — Ambulatory Visit: Payer: Self-pay | Admitting: Rehabilitative and Restorative Service Providers"

## 2021-09-16 ENCOUNTER — Other Ambulatory Visit: Payer: Self-pay

## 2021-09-16 ENCOUNTER — Emergency Department (HOSPITAL_BASED_OUTPATIENT_CLINIC_OR_DEPARTMENT_OTHER): Payer: BC Managed Care – PPO | Admitting: Radiology

## 2021-09-16 ENCOUNTER — Encounter (HOSPITAL_BASED_OUTPATIENT_CLINIC_OR_DEPARTMENT_OTHER): Payer: Self-pay | Admitting: Obstetrics and Gynecology

## 2021-09-16 ENCOUNTER — Emergency Department (HOSPITAL_BASED_OUTPATIENT_CLINIC_OR_DEPARTMENT_OTHER)
Admission: EM | Admit: 2021-09-16 | Discharge: 2021-09-16 | Disposition: A | Discharge status: Home / Self Care | Payer: BC Managed Care – PPO | Attending: Emergency Medicine | Admitting: Emergency Medicine

## 2021-09-16 DIAGNOSIS — W1839XA Other fall on same level, initial encounter: Secondary | ICD-10-CM | POA: Insufficient documentation

## 2021-09-16 DIAGNOSIS — I10 Essential (primary) hypertension: Secondary | ICD-10-CM | POA: Diagnosis not present

## 2021-09-16 DIAGNOSIS — R0789 Other chest pain: Secondary | ICD-10-CM | POA: Diagnosis present

## 2021-09-16 MED ORDER — IBUPROFEN 400 MG PO TABS
600.0000 mg | ORAL_TABLET | Freq: Once | ORAL | Status: AC
  Administered 2021-09-16: 600 mg via ORAL
  Filled 2021-09-16: qty 1

## 2021-09-16 MED ORDER — HYDROCODONE-ACETAMINOPHEN 5-325 MG PO TABS: 1.0000 | ORAL_TABLET | Freq: Four times a day (QID) | ORAL | 0 refills | Status: AC | PRN

## 2021-09-16 MED ORDER — IBUPROFEN 600 MG PO TABS: 600.0000 mg | ORAL_TABLET | Freq: Four times a day (QID) | ORAL | 0 refills | Status: AC | PRN

## 2021-09-16 MED ORDER — LIDOCAINE 5 % EX PTCH
1.0000 | MEDICATED_PATCH | Freq: Once | CUTANEOUS | Status: DC
  Administered 2021-09-16: 1 via TRANSDERMAL
  Filled 2021-09-16: qty 1

## 2021-09-16 NOTE — ED Triage Notes (Signed)
Patient reports to the ER for right side pain after a fall. Patient reports he was taking care of his dog and tumbled down some steps. Patient reports he is concerned about his ribs. Denies head injury or blood thinning medication use

## 2021-09-16 NOTE — Discharge Instructions (Addendum)
We did not see any fractures on your rib x-rays today, but sometimes you can have a small fracture in the ribs that does not show up on x-ray.  With any rib fracture or chest wall injury the main goal is pain control, and ensuring you are taking good deep breaths to prevent pneumonia.  Use incentive spirometer several times throughout the day to make sure you are taking deep breaths.  To treat pain you can apply ice and heat to the area.  You can use over-the-counter Salonpas lidocaine patches which can be applied for 12 hours at a time.  In addition to this you can use prescribed 600 mg of ibuprofen every 6 hours.  Take this regularly and you can take 650 mg of Tylenol every 6 hours as well.  If you have severe breakthrough pain you can use prescribed hydrocodone as needed.  Follow-up with your primary care doctor to ensure this is improving.  If you develop worsened chest pain, shortness of breath or difficulty breathing, fevers or productive cough return for reevaluation.

## 2022-01-04 ENCOUNTER — Other Ambulatory Visit: Payer: Self-pay | Admitting: Physician Assistant

## 2022-01-04 ENCOUNTER — Telehealth: Payer: Self-pay | Admitting: Orthopaedic Surgery

## 2022-01-04 MED ORDER — TRAMADOL HCL 50 MG PO TABS: 50.0000 mg | ORAL_TABLET | Freq: Every day | ORAL | 0 refills | Status: AC | PRN

## 2022-01-04 NOTE — Telephone Encounter (Signed)
Pt called requesting a refill of hydrocodone. Pt states he had surgery 10/2021 and injured surgery site. Pt has an upcoming appt on 10/17 but need pain medication in the mean time for his pain. Please send to pharmacy on file. Pt phone number is [phone].

## 2022-01-04 NOTE — Telephone Encounter (Signed)
We have not seen him since January, but I can send in tramadol.  Is he referring to the surgery from august of last year?

## 2022-01-11 ENCOUNTER — Encounter: Payer: BC Managed Care – PPO | Admitting: Orthopaedic Surgery

## 2022-03-22 ENCOUNTER — Encounter (HOSPITAL_COMMUNITY): Payer: Self-pay | Admitting: Emergency Medicine

## 2022-03-22 ENCOUNTER — Other Ambulatory Visit: Payer: Self-pay

## 2022-03-22 ENCOUNTER — Emergency Department (HOSPITAL_COMMUNITY)
Admission: EM | Admit: 2022-03-22 | Discharge: 2022-03-23 | Discharge status: Against Medical Advice | Payer: BC Managed Care – PPO | Attending: Emergency Medicine | Admitting: Emergency Medicine

## 2022-03-22 DIAGNOSIS — Z5321 Procedure and treatment not carried out due to patient leaving prior to being seen by health care provider: Secondary | ICD-10-CM | POA: Insufficient documentation

## 2022-03-22 DIAGNOSIS — H53149 Visual discomfort, unspecified: Secondary | ICD-10-CM | POA: Insufficient documentation

## 2022-03-22 NOTE — ED Provider Triage Note (Signed)
  Emergency Medicine Provider Triage Evaluation Note  MRN:  [id number]  Arrival date & time: 03/22/22    Medically screening exam initiated at 11:37 PM.   CC:   Post-op Problem   HPI:  Micheal Hurley is a 51 y.o. year-old male presents to the ED with chief complaint of eye pain.  States that he had cataract surgery on 02/28/22 and now reports having eye pain, photophobia.  He denies flashers, double vision.  He states he has been using his eye drops.  Cataract surgery done by ?Dr. Vonna Kotyk?.  History provided by patient. ROS:  -As included in HPI PE:  There were no vitals filed for this visit. Vitals didn't cross over.  I asked the tech about this and when they went to recheck, they found patient had eloped. Non-toxic appearing No respiratory distress Mild left eye erythema MDM:  Based on signs and symptoms, post-op complication is highest on my differential.   Patient was informed that the remainder of the evaluation will be completed by another provider, this initial triage assessment does not replace that evaluation, and the importance of remaining in the ED until their evaluation is complete.    Roxy Horseman, PA-C 03/23/22 [phone]

## 2022-03-22 NOTE — ED Triage Notes (Signed)
Presents for photophobia and pain that started tonight in L eye.  Had cataract surgery on 03/01/23.  Denies double vision, flashing lights in vision Has been consistently using prescribed eye drops.

## 2022-03-23 NOTE — ED Notes (Signed)
Pt stated he just put some eye drops in his eyes that was prescribed and it feels a little better. Pt also states he is just going to go head and leave and see his eye doctor in the morning.

## 2022-08-23 ENCOUNTER — Emergency Department (HOSPITAL_COMMUNITY)
Admission: EM | Admit: 2022-08-23 | Discharge: 2022-08-23 | Disposition: A | Discharge status: Home / Self Care | Payer: 59 | Attending: Emergency Medicine | Admitting: Emergency Medicine

## 2022-08-23 ENCOUNTER — Emergency Department (HOSPITAL_COMMUNITY): Payer: 59

## 2022-08-23 ENCOUNTER — Encounter (HOSPITAL_COMMUNITY): Payer: Self-pay

## 2022-08-23 ENCOUNTER — Other Ambulatory Visit: Payer: Self-pay

## 2022-08-23 DIAGNOSIS — S62663B Nondisplaced fracture of distal phalanx of left middle finger, initial encounter for open fracture: Secondary | ICD-10-CM | POA: Diagnosis not present

## 2022-08-23 DIAGNOSIS — W232XXA Caught, crushed, jammed or pinched between a moving and stationary object, initial encounter: Secondary | ICD-10-CM | POA: Diagnosis not present

## 2022-08-23 DIAGNOSIS — S6992XA Unspecified injury of left wrist, hand and finger(s), initial encounter: Secondary | ICD-10-CM | POA: Diagnosis present

## 2022-08-23 DIAGNOSIS — S61303A Unspecified open wound of left middle finger with damage to nail, initial encounter: Secondary | ICD-10-CM | POA: Insufficient documentation

## 2022-08-23 DIAGNOSIS — S61309A Unspecified open wound of unspecified finger with damage to nail, initial encounter: Secondary | ICD-10-CM

## 2022-08-23 DIAGNOSIS — Y990 Civilian activity done for income or pay: Secondary | ICD-10-CM | POA: Diagnosis not present

## 2022-08-23 DIAGNOSIS — Z23 Encounter for immunization: Secondary | ICD-10-CM | POA: Diagnosis not present

## 2022-08-23 MED ORDER — OXYCODONE-ACETAMINOPHEN 5-325 MG PO TABS
1.0000 | ORAL_TABLET | Freq: Once | ORAL | Status: AC
  Administered 2022-08-23: 1 via ORAL
  Filled 2022-08-23: qty 1

## 2022-08-23 MED ORDER — TETANUS-DIPHTH-ACELL PERTUSSIS 5-2.5-18.5 LF-MCG/0.5 IM SUSY
0.5000 mL | PREFILLED_SYRINGE | Freq: Once | INTRAMUSCULAR | Status: AC
  Administered 2022-08-23: 0.5 mL via INTRAMUSCULAR
  Filled 2022-08-23: qty 0.5

## 2022-08-23 MED ORDER — LIDOCAINE HCL (PF) 1 % IJ SOLN
5.0000 mL | Freq: Once | INTRAMUSCULAR | Status: AC
  Administered 2022-08-23: 5 mL
  Filled 2022-08-23: qty 5

## 2022-08-23 MED ORDER — OXYCODONE-ACETAMINOPHEN 5-325 MG PO TABS: 1.0000 | ORAL_TABLET | Freq: Four times a day (QID) | ORAL | 0 refills | Status: AC | PRN

## 2022-08-23 MED ORDER — CEPHALEXIN 500 MG PO CAPS: 500.0000 mg | ORAL_CAPSULE | Freq: Four times a day (QID) | ORAL | 0 refills | Status: AC

## 2022-08-23 NOTE — ED Triage Notes (Signed)
Pt arrives with c/o left middle finger injury that happened today. Per pt, he got his finger stuck in a pully device. Pt has laceration near his nailbed.

## 2022-08-23 NOTE — ED Provider Notes (Signed)
[name] EMERGENCY DEPARTMENT AT [facility] Provider Note   CSN: [id number] Arrival date & time: 08/23/22  1705     History Chief Complaint  Patient presents with   Finger Injury    Micheal Hurley is a 52 y.o. male.  Patient presents emergency department complaints of finger injury.  He reports that he got his finger stuck in a pulley at work resulting in a crush injury.  Does not recall exactly how long his finger was stuck in the pulley system for.  Patient's wife took pictures before wound is wrapped and reports that there is an injury to the nailbed as the nailbed is avulsed upward on the proximal tip of the nailbed. Patient is right handed.   HPI     Home Medications Prior to Admission medications   Medication Sig Start Date End Date Taking? Authorizing Provider  cephALEXin (KEFLEX) 500 MG capsule Take 1 capsule (500 mg total) by mouth 4 (four) times daily for 7 days. 08/23/22 08/30/22 Yes Smitty Knudsen, PA-C  oxyCODONE-acetaminophen (PERCOCET/ROXICET) 5-325 MG tablet Take 1 tablet by mouth every 6 (six) hours as needed for severe pain. 08/23/22  Yes Smitty Knudsen, PA-C  baclofen (LIORESAL) 10 MG tablet Take 0.5-1 tablets (5-10 mg total) by mouth 3 (three) times daily as needed for muscle spasms. 09/25/20   Hilts, Casimiro Needle, MD  EPINEPHrine 0.3 mg/0.3 mL IJ SOAJ injection Inject 0.3 mLs (0.3 mg total) into the muscle as needed for anaphylaxis. 09/06/19   Placido Sou, PA-C  esomeprazole (NEXIUM) 40 MG capsule Take by mouth. 05/10/19   [provider]  HYDROcodone-acetaminophen (NORCO/VICODIN) 5-325 MG tablet Take 1 tablet by mouth every 6 (six) hours as needed. 09/16/21   Dartha Lodge, PA-C  ibuprofen (ADVIL) 600 MG tablet Take 1 tablet (600 mg total) by mouth every 6 (six) hours as needed. 09/16/21   Dartha Lodge, PA-C  lidocaine (LIDODERM) 5 % Place 1 patch onto the skin daily. Remove & Discard patch within 12 hours or as directed by MD 09/21/20   Sponseller,  Eugene Gavia, PA-C  meloxicam (MOBIC) 15 MG tablet TAKE 1 TABLET(15 MG) BY MOUTH DAILY 01/11/21   Vivi Barrack, DPM  traMADol (ULTRAM) 50 MG tablet Take 1-2 tablets (50-100 mg total) by mouth daily as needed. 01/04/22   Cristie Hem, PA-C      Allergies    Bee venom    Review of Systems   Review of Systems  Skin:  Positive for wound.  All other systems reviewed and are negative.   Physical Exam Updated Vital Signs BP (!) 149/73   Pulse (!) 56   Temp 98 F (36.7 C)   Resp 16   Ht 5\' 8"  (1.727 m)   Wt 90.7 kg   SpO2 96%   BMI 30.41 kg/m  Physical Exam Vitals and nursing note reviewed.  Constitutional:      General: He is not in acute distress.    Appearance: He is well-developed.  HENT:     Head: Normocephalic and atraumatic.  Eyes:     Conjunctiva/sclera: Conjunctivae normal.  Cardiovascular:     Rate and Rhythm: Normal rate and regular rhythm.     Heart sounds: No murmur heard. Pulmonary:     Effort: Pulmonary effort is normal. No respiratory distress.     Breath sounds: Normal breath sounds.  Abdominal:     Palpations: Abdomen is soft.     Tenderness: There is no abdominal tenderness.  Musculoskeletal:        General: No swelling.     Cervical back: Neck supple.  Skin:    General: Skin is warm and dry.     Capillary Refill: Capillary refill takes less than 2 seconds.     Findings: Lesion present.     Comments: Nail avulsion of the left 3rd digit.  Neurological:     Mental Status: He is alert.  Psychiatric:        Mood and Affect: Mood normal.        ED Results / Procedures / Treatments   Labs (all labs ordered are listed, but only abnormal results are displayed) Labs Reviewed - No data to display  EKG None  Radiology No results found.  Procedures .Nail Removal  Date/Time: 08/29/2022 9:34 PM  Performed by: Smitty Knudsen, PA-C Authorized by: Smitty Knudsen, PA-C   Consent:    Consent obtained:  Verbal   Consent given by:   Patient   Risks, benefits, and alternatives were discussed: yes     Risks discussed:  Bleeding, incomplete removal, infection and permanent nail deformity   Alternatives discussed:  No treatment Universal protocol:    Patient identity confirmed:  Verbally with patient Location:    Hand:  L long finger Pre-procedure details:    Skin preparation:  Chlorhexidine   Preparation: Patient was prepped and draped in the usual sterile fashion   Anesthesia:    Anesthesia method:  Nerve block   Block location:  Digital nerve block, palmar aspect   Block needle gauge:  25 G   Block anesthetic:  Lidocaine 2% w/o epi   Block injection procedure:  Anatomic landmarks identified, introduced needle, incremental injection, anatomic landmarks palpated and negative aspiration for blood   Block outcome:  Anesthesia achieved Nail Removal:    Nail removed:  Complete   Nail bed repaired: no     Removed nail replaced and anchored: yes     Stented with:  6-0 chromic gut, x3 sutures to anchor nail plate Trephination:    Subungual hematoma drained: no   Post-procedure details:    Dressing:  Splint and non-adhesive packing strip   Procedure completion:  Tolerated    Medications Ordered in ED Medications  oxyCODONE-acetaminophen (PERCOCET/ROXICET) 5-325 MG per tablet 1 tablet (1 tablet Oral Given 08/23/22 2112)  lidocaine (PF) (XYLOCAINE) 1 % injection 5 mL (5 mLs Infiltration Given 08/23/22 2154)  Tdap (BOOSTRIX) injection 0.5 mL (0.5 mLs Intramuscular Given 08/23/22 2155)    ED Course/ Medical Decision Making/ A&P                           Medical Decision Making Amount and/or Complexity of Data Reviewed Radiology: ordered.  Risk Prescription drug management.   This patient presents to the ED for concern of finger injury. Differential diagnosis includes comminuted fracture, nail avulsion, finger laceration   Imaging Studies ordered:  I ordered imaging studies including left middle finger x-ray I  independently visualized and interpreted imaging which showed comminuted fracture of third distal phalanx I agree with the radiologist interpretation   Medicines ordered and prescription drug management:  I ordered medication including Tdap, Percocet, lidocaine for wound prophylaxis, pain, anesthesia Reevaluation of the patient after these medicines showed that the patient improved I have reviewed the patients home medicines and have made adjustments as needed   Problem List / ED Course:  Patient presented to the ED following finger injury. He  reports his left middle finger was crushed in a pulley system at work and remained there for a few seconds. Notable bleeding immediately after injury with significant pain. On presentation, bleeding largely controlled and pain reduced. Imaging ordered for evaluation. Xray shows signs of comminuted fracture in the distal phalanx of the 3rd digit. Informed patient of findings. Evaluation of finger showed almost complete nail avulsion of the injured finger, but no obvious damage to nail bed could be visualized.  Partial nail avulsion fracture present.  Wound does not appear to be grossly contaminated.  Will consult hand surgery for further recommendations. Dr. Izora Ribas advises to try to remove nail plate and reattach plate onto nail bed as protective coating. Outpatient abx and follow up with hand in clinic. Nail plate was removed and reattached with chromic gut sutures and advised patient he would need hand surgery evaluation given fracture and avulsion of nail. Patient verbalized understanding this treatment plan and all return precautions. Patient agreeable with plan. Provided patient with information for Dr. Izora Ribas for further evaluation outpatient in the next few days. Will discharge with prescription for Keflex and Percocet for pain management and reduce risk of infection at injured site.  Final Clinical Impression(s) / ED Diagnoses Final diagnoses:   Avulsion of fingernail, initial encounter  Open nondisplaced fracture of distal phalanx of left middle finger, initial encounter    Rx / DC Orders ED Discharge Orders          Ordered    cephALEXin (KEFLEX) 500 MG capsule  4 times daily        08/23/22 2239    oxyCODONE-acetaminophen (PERCOCET/ROXICET) 5-325 MG tablet  Every 6 hours PRN        08/23/22 2240              Salomon Mast 08/29/22 2137    Jacalyn Lefevre, MD 08/30/22 [phone]

## 2022-08-23 NOTE — Progress Notes (Signed)
Orthopedic Tech Progress Note Patient Details:  Micheal Hurley 1971-03-04 [id number]  Ortho Devices Type of Ortho Device: Finger splint Ortho Device/Splint Location: lue Ortho Device/Splint Interventions: Ordered, Application, Adjustment   Post Interventions Patient Tolerated: Well  Al Decant 08/23/2022, 11:18 PM

## 2022-08-23 NOTE — Discharge Instructions (Addendum)
You were seen in the emergency department for a finger injury. There was a fracture of the third finger on the left hand as well as a nail avulsion injury. I reached out to Dr. Izora Ribas with hand surgery who advised removal of the nail plate and replacing it onto the finger with follow up with his office. They should reach out to you to schedule a visit, but you have their information if you would prefer to be seen by them sooner. I have sent you antibiotics and pain medicine to your pharmacy. Please take these as prescribed. If any concerns for worsening of your symptoms, please return to the ER.

## 2023-10-22 ENCOUNTER — Ambulatory Visit (HOSPITAL_COMMUNITY)
Admission: EM | Admit: 2023-10-22 | Discharge: 2023-10-22 | Disposition: A | Discharge status: Home / Self Care | Attending: Emergency Medicine | Admitting: Emergency Medicine

## 2023-10-22 ENCOUNTER — Encounter (HOSPITAL_COMMUNITY): Payer: Self-pay | Admitting: *Deleted

## 2023-10-22 ENCOUNTER — Ambulatory Visit (INDEPENDENT_AMBULATORY_CARE_PROVIDER_SITE_OTHER)

## 2023-10-22 DIAGNOSIS — M10072 Idiopathic gout, left ankle and foot: Secondary | ICD-10-CM | POA: Diagnosis not present

## 2023-10-22 DIAGNOSIS — M109 Gout, unspecified: Secondary | ICD-10-CM

## 2023-10-22 DIAGNOSIS — N289 Disorder of kidney and ureter, unspecified: Secondary | ICD-10-CM | POA: Insufficient documentation

## 2023-10-22 DIAGNOSIS — M79675 Pain in left toe(s): Secondary | ICD-10-CM | POA: Diagnosis not present

## 2023-10-22 DIAGNOSIS — Z87891 Personal history of nicotine dependence: Secondary | ICD-10-CM | POA: Insufficient documentation

## 2023-10-22 LAB — COMPREHENSIVE METABOLIC PANEL WITH GFR
ALT: 30 U/L (ref 0–44)
AST: 31 U/L (ref 15–41)
Albumin: 3.7 g/dL (ref 3.5–5.0)
Alkaline Phosphatase: 80 U/L (ref 38–126)
Anion gap: 12 (ref 5–15)
BUN: 14 mg/dL (ref 6–20)
CO2: 23 mmol/L (ref 22–32)
Calcium: 9.1 mg/dL (ref 8.9–10.3)
Chloride: 103 mmol/L (ref 98–111)
Creatinine, Ser: 1.52 mg/dL — ABNORMAL HIGH (ref 0.61–1.24)
GFR, Estimated: 54 mL/min — ABNORMAL LOW (ref >60)
Glucose, Bld: 87 mg/dL (ref 70–99)
Potassium: 4.4 mmol/L (ref 3.5–5.1)
Sodium: 138 mmol/L (ref 135–145)
Total Bilirubin: 0.9 mg/dL (ref 0.0–1.2)
Total Protein: 7.2 g/dL (ref 6.5–8.1)

## 2023-10-22 LAB — CBC WITH DIFFERENTIAL/PLATELET
Abs Immature Granulocytes: 0.02 K/uL (ref 0.00–0.07)
Basophils Absolute: 0.1 K/uL (ref 0.0–0.1)
Basophils Relative: 1 %
Eosinophils Absolute: 0.1 K/uL (ref 0.0–0.5)
Eosinophils Relative: 1 %
HCT: 46.6 % (ref 39.0–52.0)
Hemoglobin: 16.6 g/dL (ref 13.0–17.0)
Immature Granulocytes: 0 %
Lymphocytes Relative: 38 %
Lymphs Abs: 2.7 K/uL (ref 0.7–4.0)
MCH: 32.8 pg (ref 26.0–34.0)
MCHC: 35.6 g/dL (ref 30.0–36.0)
MCV: 92.1 fL (ref 80.0–100.0)
Monocytes Absolute: 0.9 K/uL (ref 0.1–1.0)
Monocytes Relative: 13 %
Neutro Abs: 3.4 K/uL (ref 1.7–7.7)
Neutrophils Relative %: 47 %
Platelets: 203 K/uL (ref 150–400)
RBC: 5.06 MIL/uL (ref 4.22–5.81)
RDW: 13 % (ref 11.5–15.5)
WBC: 7.2 K/uL (ref 4.0–10.5)
nRBC: 0 % (ref 0.0–0.2)

## 2023-10-22 LAB — URIC ACID: Uric Acid, Serum: 7.7 mg/dL (ref 3.7–8.6)

## 2023-10-22 MED ORDER — METHYLPREDNISOLONE 4 MG PO TABS: ORAL_TABLET | ORAL | 0 refills | Status: AC

## 2023-10-22 NOTE — ED Provider Notes (Signed)
 [facility]    CSN: [id number] Arrival date & time: 10/22/23  1751    HISTORY   Chief Complaint  Patient presents with   Toe Pain   HPI Micheal Hurley is a pleasant, 53 y.o. male who presents to urgent care today. Pt states that he thinks that he jammed his left great toe about 3 weeks ago and believes he may have jammed it a few more times since then.  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  He has tried to pop it out but has not been successful.  States he hasn't been taking any medication to relieve his pain, states he is a boxer and tolerates pain really well.  Adds that this toe however is causing him more pain than he can tolerate.  Patient reports a family history of gout but states he has never had a gout flare himself.  Patient states he does not drink alcohol but does like to eat red meat and greens.  The history is provided by the patient.  Toe Pain   Past Medical History:  Diagnosis Date   Anxiety    Depression    GERD (gastroesophageal reflux disease)    Hypertension    Substance abuse (HCC)    in remission since 2017   Patient Active Problem List   Diagnosis Date Noted   Traumatic tear of supraspinatus tendon, right, initial encounter 11/18/2020   Impingement syndrome of right shoulder 11/18/2020   Opioid use disorder, severe, dependence (HCC) 11/14/2015   MDD (major depressive disorder) 11/13/2015   Past Surgical History:  Procedure Laterality Date   NO PAST SURGERIES     SHOULDER ARTHROSCOPY WITH ROTATOR CUFF REPAIR AND SUBACROMIAL DECOMPRESSION Right 11/18/2020   Procedure: RIGHT SHOULDER ARTHROSCOPY WITH ROTATOR CUFF REPAIR AND SUBACROMIAL DECOMPRESSION, AND EXTENSIVE DEBRIDEMENT;  Surgeon: Jerri Kay HERO, MD;  Location: [facility];  Service: Orthopedics;  Laterality: Right;    Home Medications    Prior to Admission medications   Medication Sig Start Date End Date Taking? Authorizing Provider  amitriptyline  (ELAVIL) 10 MG tablet Take 10-20 mg by mouth at bedtime as needed. 09/06/23  Yes [provider]  anastrozole (ARIMIDEX) 1 MG tablet Take 1 mg by mouth once a week.   Yes [provider]  divalproex (DEPAKOTE) 125 MG DR tablet Take 375 mg by mouth daily. 09/07/23  Yes [provider]  divalproex (DEPAKOTE) 250 MG DR tablet Take 250 mg by mouth at bedtime. 06/22/23  Yes [provider]  tamsulosin (FLOMAX) 0.4 MG CAPS capsule TAKE 1 CAPSULE(0.4 MG) BY MOUTH DAILY AFTER DINNER 10/19/23  Yes [provider]  baclofen  (LIORESAL ) 10 MG tablet Take 0.5-1 tablets (5-10 mg total) by mouth 3 (three) times daily as needed for muscle spasms. 09/25/20   Hilts, Ozell, MD  EPINEPHrine  0.3 mg/0.3 mL IJ SOAJ injection Inject 0.3 mLs (0.3 mg total) into the muscle as needed for anaphylaxis. 09/06/19   Joldersma, Logan, PA-C  esomeprazole (NEXIUM) 40 MG capsule Take by mouth. 05/10/19   [provider]  HYDROcodone -acetaminophen  (NORCO/VICODIN) 5-325 MG tablet Take 1 tablet by mouth every 6 (six) hours as needed. 09/16/21   Kehrli, Kelsey F, PA-C  ibuprofen  (ADVIL ) 600 MG tablet Take 1 tablet (600 mg total) by mouth every 6 (six) hours as needed. 09/16/21   Kehrli, Kelsey F, PA-C  lidocaine  (LIDODERM ) 5 % Place 1 patch onto the skin daily. Remove & Discard patch within 12 hours or as  directed by MD 09/21/20   Sponseller, Pleasant R, PA-C  meloxicam  (MOBIC ) 15 MG tablet TAKE 1 TABLET(15 MG) BY MOUTH DAILY 01/11/21   Gershon Donnice SAUNDERS, DPM  oxyCODONE -acetaminophen  (PERCOCET/ROXICET) 5-325 MG tablet Take 1 tablet by mouth every 6 (six) hours as needed for severe pain. 08/23/22   Zelaya, Oscar A, PA-C  traMADol  (ULTRAM ) 50 MG tablet Take 1-2 tablets (50-100 mg total) by mouth daily as needed. 01/04/22   Jule Ronal CROME, PA-C    Family History Family History  Problem Relation Age of Onset   Mental illness Neg Hx    Social History Social History   Tobacco Use   Smoking  status: Former    Current packs/day: 0.00    Average packs/day: 0.5 packs/day for 20.0 years (10.0 ttl pk-yrs)    Types: Cigarettes    Start date: 07/30/1996    Quit date: 07/30/2016    Years since quitting: 7.2   Smokeless tobacco: Never  Vaping Use   Vaping status: Never Used  Substance Use Topics   Alcohol use: No    Comment: history   Drug use: Yes    Comment: opiates - quit  August 2017   Allergies   Bee venom  Review of Systems Review of Systems Pertinent findings revealed after performing a 14 point review of systems has been noted in the history of present illness.  Physical Exam Vital Signs BP (!) 147/83 (BP Location: Left Arm)   Pulse 63   Temp 98.1 F (36.7 C) (Oral)   Resp 18   SpO2 94%   No data found.  Physical Exam Vitals and nursing note reviewed.  Constitutional:      General: He is awake.     Appearance: Normal appearance. He is well-developed and well-groomed. He is not ill-appearing.  Eyes:     Conjunctiva/sclera:     Right eye: Right conjunctiva is injected.     Left eye: Left conjunctiva is injected.  Feet:     Comments: Left great toe with mild deformity, erythema, tenderness to palpation without warmth or significant swelling.  Great toenail is normal in appearance.  There is large rough callus on the lateral aspect of left great toe pad without signs of fluctuance or breakdown. Neurological:     Mental Status: He is alert.  Psychiatric:        Behavior: Behavior is cooperative.     UC Couse / Diagnostics / Procedures:     Radiology DG Toe Great Left Result Date: 10/22/2023 CLINICAL DATA:  Left great toe injury, swelling, pain, reduced range of motion EXAM: LEFT GREAT TOE COMPARISON:  10/15/2020 FINDINGS: Bifid medial first digit sesamoid. Small geode or degenerative subcortical cyst along the distal margin of the first metacarpal head, no change from 2022. No fracture or acute bony findings. Suspected mild soft tissue swelling in the great  toe potentially extending into the dorsum of the foot. IMPRESSION: 1. Suspected mild soft tissue swelling in the great toe potentially extending into the dorsum of the foot. No fracture or acute bony findings. Electronically Signed   By: Ryan Salvage M.D.   On: 10/22/2023 18:53    Procedures Procedures (including critical care time) EKG  Pending results:  Labs Reviewed  COMPREHENSIVE METABOLIC PANEL WITH GFR - Abnormal; Notable for the following components:      Result Value   Creatinine, Ser 1.52 (*)    GFR, Estimated 54 (*)    All other components within normal limits  CBC WITH  DIFFERENTIAL/PLATELET  URIC ACID    Medications Ordered in UC: Medications - No data to display  UC Diagnoses / Final Clinical Impressions(s)   I have reviewed the triage vital signs and the nursing notes.  Pertinent labs & imaging results that were available during my care of the patient were reviewed by me and considered in my medical decision making (see chart for details).    Final diagnoses:  Great toe pain, left  Acute gout involving toe of left foot, unspecified cause   Patient advised that x-ray of left great toe was negative.  This raises the suspicion that patient is having an initial gout flare.  Because it has been 3 weeks since symptoms began, colchicine may not provide best benefit so patient provided with prescription for tapering dose of methylprednisolone .  Obtained uric acid level, comprehensive metabolic panel and CBC.    At the time of the writing of this note, uric acid level was normal which is not unexpected given that it has been 3 weeks since his symptoms began.  Patient did have diminished kidney function and was notified by phone by triage nurse.  Close follow-up with PCP recommended.  Return precautions advised.  Please see discharge instructions below for details of plan of care as provided to patient. ED Prescriptions     Medication Sig Dispense Auth. Provider    methylPREDNISolone  (MEDROL ) 4 MG tablet Take 4 tablets (16 mg total) by mouth 2 (two) times daily for 3 days, THEN 3 tablets (12 mg total) daily for 3 days, THEN 2 tablets (8 mg total) daily for 3 days, THEN 1 tablet (4 mg total) daily for 3 days. 42 tablet Joesph Shaver Scales, PA-C      PDMP not reviewed this encounter.    Discharge Instructions      The x-ray of your left great toe was not concerning for acute bony injury.  I believe that your symptoms are related to gout, particularly since you have a family history of gout.  I have enclosed some information about avoiding purines in your diet.  This will help reduce the incidence of gout.  I sent a prescription for steroids to your pharmacy.  Please take them exactly as prescribed.  We will contact you with results of your blood work and provide you with any adjustments to your treatment based on those results.  Thank you for visiting [facility] today.      Disposition Upon Discharge:  Condition: stable for discharge home Home: take medications as prescribed; routine discharge instructions as discussed; follow up as advised.  Patient presented with an acute illness with associated systemic symptoms and significant discomfort requiring urgent management. In my opinion, this is a condition that a prudent lay person (someone who possesses an average knowledge of health and medicine) may potentially expect to result in complications if not addressed urgently such as respiratory distress, impairment of bodily function or dysfunction of bodily organs.   Routine symptom specific, illness specific and/or disease specific instructions were discussed with the patient and/or caregiver at length.   As such, the patient has been evaluated and assessed, work-up was performed and treatment was provided in alignment with urgent care protocols and evidence based medicine.  Patient/parent/caregiver has been advised that the patient  may require follow up for further testing and treatment if the symptoms continue in spite of treatment, as clinically indicated and appropriate.  Patient/parent/caregiver has been advised to report to orthopedic urgent care clinic or return  to the [facility] or PCP in 3-5 days if no better; follow-up with orthopedics, PCP or the Emergency Department if new signs and symptoms develop or if the current signs or symptoms continue to change or worsen for further workup, evaluation and treatment as clinically indicated and appropriate  The patient will follow up with their current PCP if and as advised. If the patient does not currently have a PCP we will have assisted them in obtaining one.   The patient may need specialty follow up if the symptoms continue, in spite of conservative treatment and management, for further workup, evaluation, consultation and treatment as clinically indicated and appropriate.  Patient/parent/caregiver verbalized understanding and agreement of plan as discussed.  All questions were addressed during visit.  Please see discharge instructions below for further details of plan.  This office note has been dictated using Teaching laboratory technician.  Unfortunately, this method of dictation can sometimes lead to typographical or grammatical errors.  I apologize for your inconvenience in advance if this occurs.  Please do not hesitate to reach out to me if clarification is needed.      Joesph Shaver Scales, PA-C 10/23/23 1444

## 2023-10-22 NOTE — ED Triage Notes (Signed)
 Pt states that his left great toe has been jammed X 3 weeks. He has tried to pop it out but hasn't worked. He hasn't been taking any med.

## 2023-10-22 NOTE — Discharge Instructions (Signed)
 The x-ray of your left great toe was not concerning for acute bony injury.  I believe that your symptoms are related to gout, particularly since you have a family history of gout.  I have enclosed some information about avoiding purines in your diet.  This will help reduce the incidence of gout.  I sent a prescription for steroids to your pharmacy.  Please take them exactly as prescribed.  We will contact you with results of your blood work and provide you with any adjustments to your treatment based on those results.  Thank you for visiting [facility] today.

## 2023-10-23 ENCOUNTER — Ambulatory Visit: Payer: Self-pay | Admitting: Emergency Medicine

## 2023-10-23 NOTE — Progress Notes (Signed)
 Please advise patient that his complete blood cell count was normal and was not concerning for any sign of infection.  His uric acid level was at the higher end of normal and while this does not prove that he has gout, it does not rule it out because he has been 3 weeks since his symptoms began.  He should complete the steroid as prescribed.  Finally, please also advise patient that his metabolic panel revealed normal liver function but diminished kidney function which may or may not be related to possible gout.  I recommend that he follow-up with his primary care provider in the next 7 to 10 days for repeat kidney function testing.  Thank you!
